# Patient Record
Sex: Female | Born: 1965 | Race: White | Hispanic: No | Marital: Single | Smoking: Current every day smoker
Health system: Southern US, Community
[De-identification: ages and names within clinical notes are randomized; demographics above are authoritative.]

## PROBLEM LIST (undated history)

## (undated) DIAGNOSIS — F1090 Alcohol use, unspecified, uncomplicated: Secondary | ICD-10-CM

## (undated) DIAGNOSIS — Z789 Other specified health status: Secondary | ICD-10-CM

## (undated) DIAGNOSIS — Z720 Tobacco use: Secondary | ICD-10-CM

## (undated) DIAGNOSIS — E119 Type 2 diabetes mellitus without complications: Secondary | ICD-10-CM

## (undated) DIAGNOSIS — Z7289 Other problems related to lifestyle: Secondary | ICD-10-CM

---

## 2008-07-04 ENCOUNTER — Emergency Department: Payer: Self-pay

## 2008-07-04 IMAGING — CR DG CHEST 2V
1 series · 2 of 2 positions shown · non-contrast
Comparison: none

REASON FOR EXAM: palpitations
COMMENTS:

PROCEDURE:     DXR - DXR CHEST PA (OR AP) AND LATERAL  - [DATE] [DATE]
RESULT:     The lung fields are clear. No pneumonia, pneumothorax or pleural
effusion is seen. Heart size is normal. The chest appears mildly
hyperexpanded. The mediastinal and osseous structures are normal in
appearance.

[Series 1: view not recorded · 0.17mm/px · 2 of 2 slices shown]
[im 1/2]
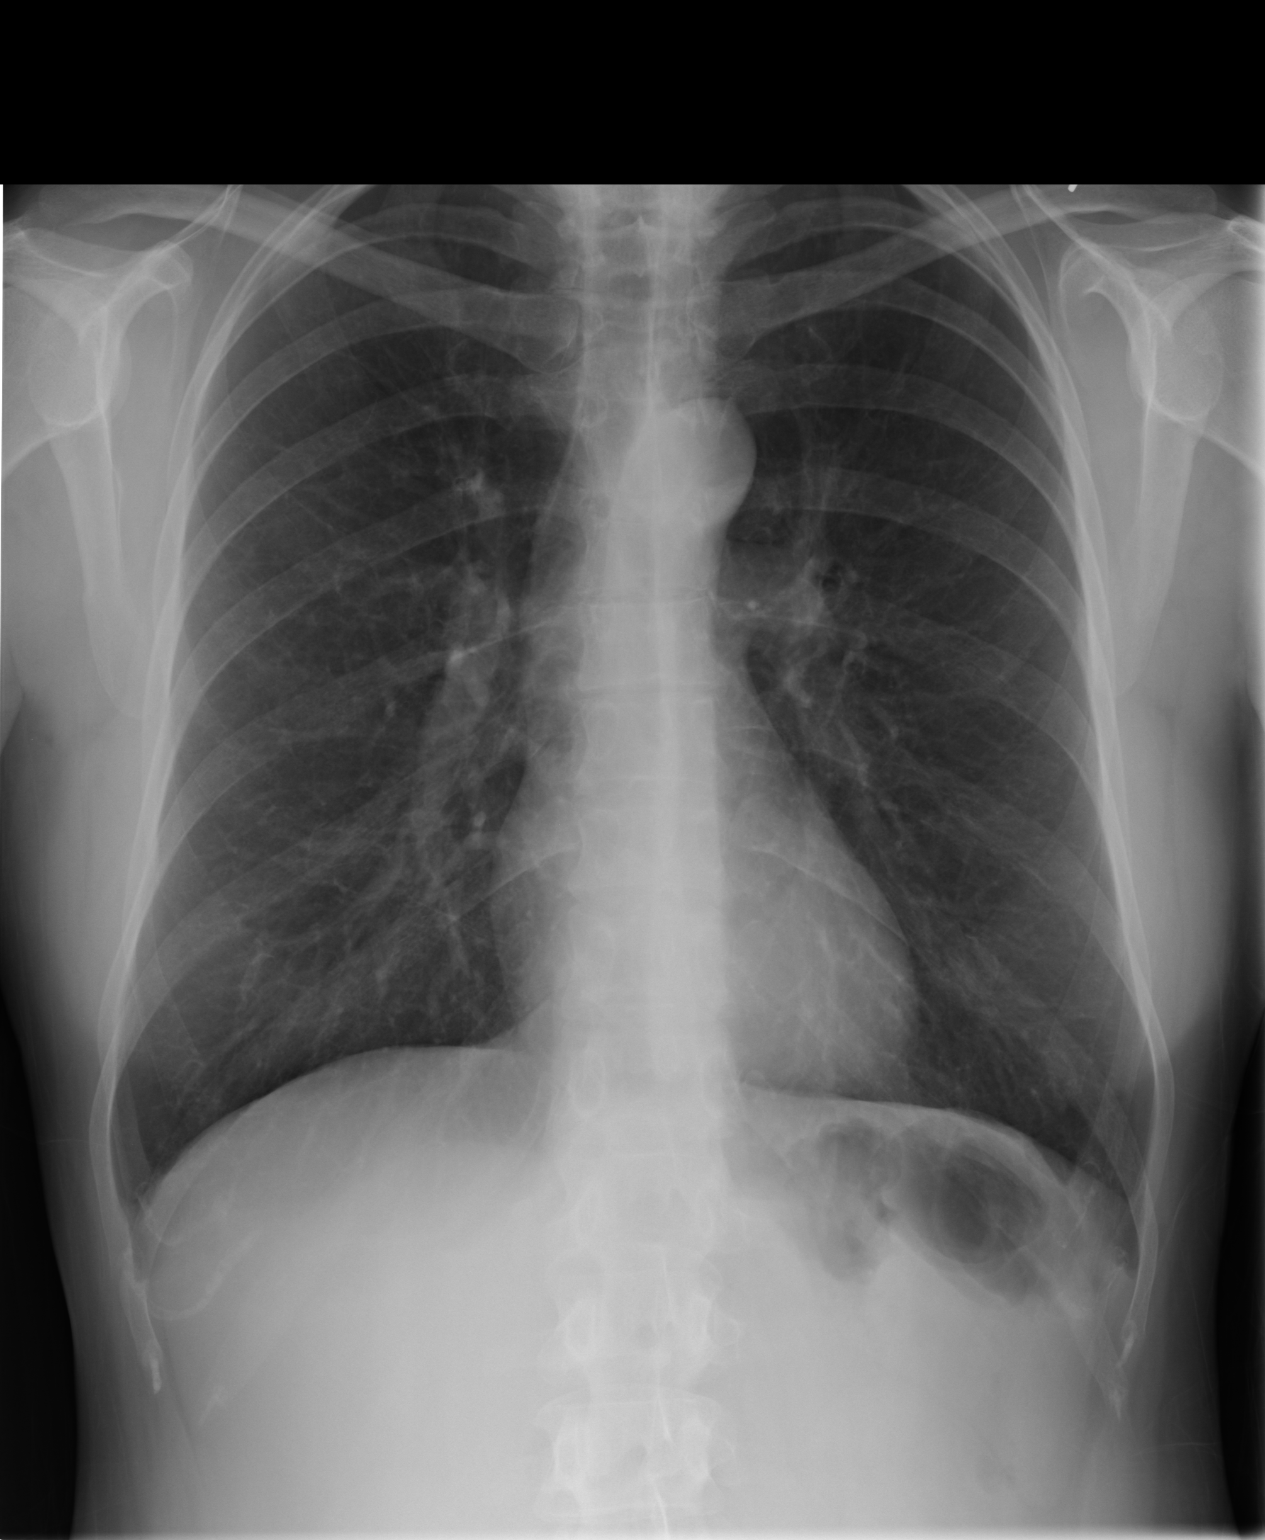
[im 2/2]
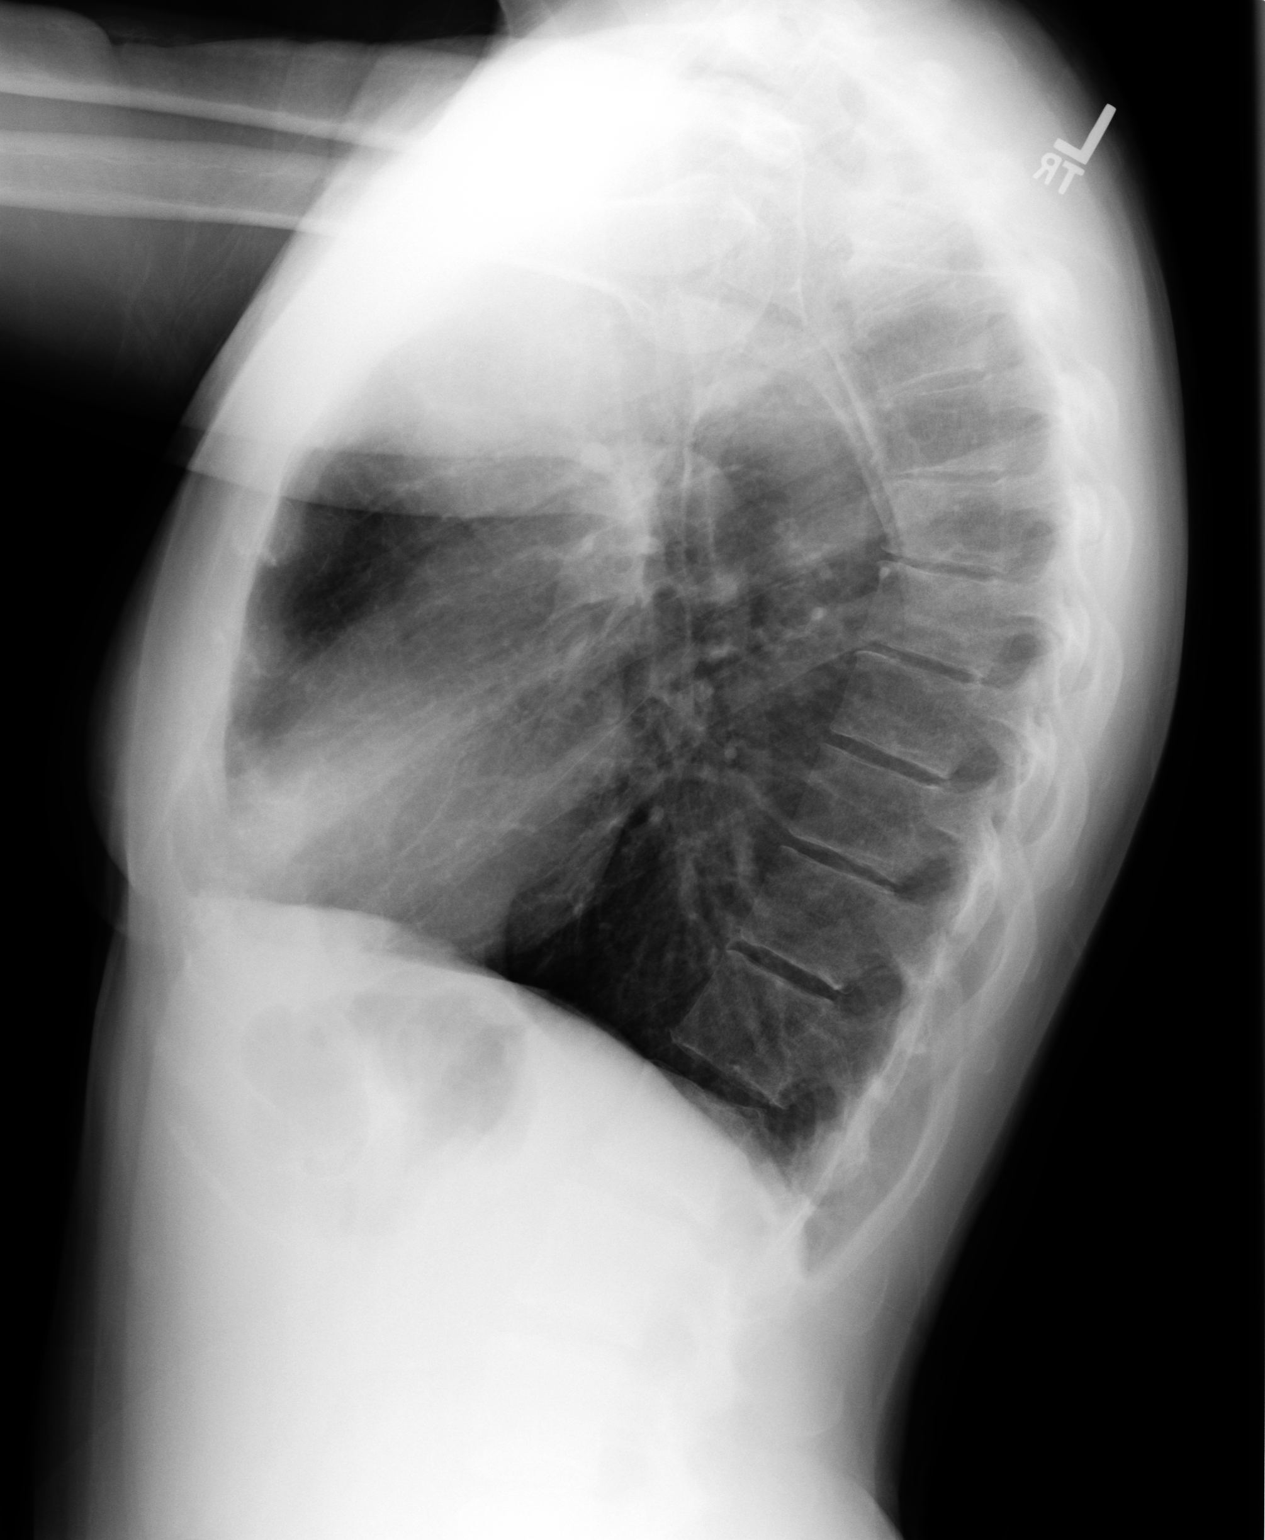

[2 of 2 positions shown; findings below may reference images not displayed]

IMPRESSION: 1. The lung fields are clear.
2. The chest appears mildly hyperexpanded.

## 2009-02-08 ENCOUNTER — Ambulatory Visit: Payer: Self-pay | Admitting: Family Medicine

## 2013-09-17 ENCOUNTER — Ambulatory Visit: Payer: Self-pay | Admitting: Nurse Practitioner

## 2013-09-17 IMAGING — US ABDOMEN ULTRASOUND LIMITED
1 series · 14 of 25 positions shown · non-contrast
Comparison: None.

CLINICAL DATA: Epigastric and right upper quadrant pain; history of
heavy alcohol use, elevated hepatic function studies

EXAM:
US ABDOMEN LIMITED - RIGHT UPPER QUADRANT

[Series 1: abdomen ultrasound limited · 0.21mm/px · 14 of 38 slices shown]
[im 1/38]
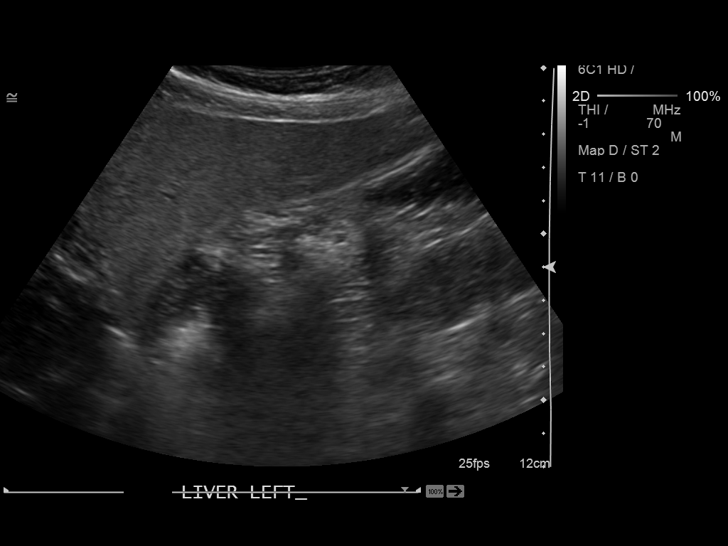
[im 4/38]
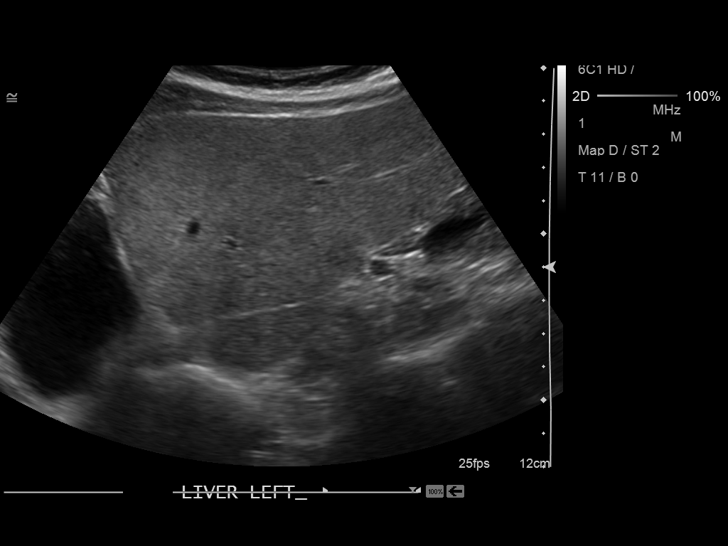
[im 7/38]
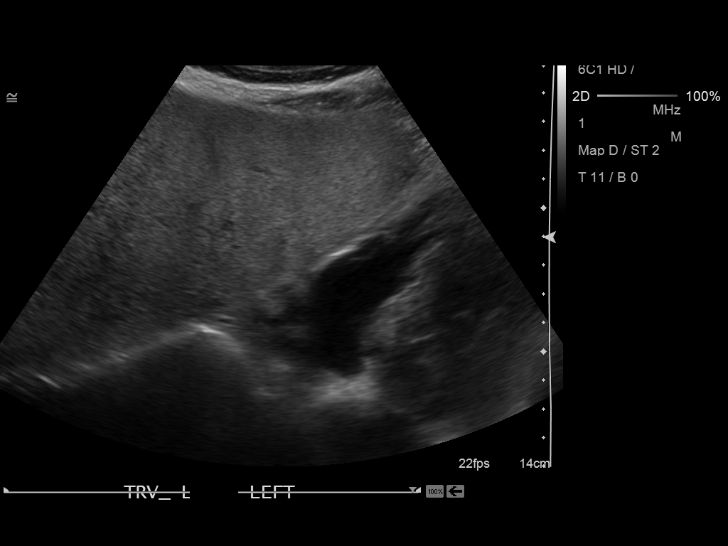
[im 10/38]
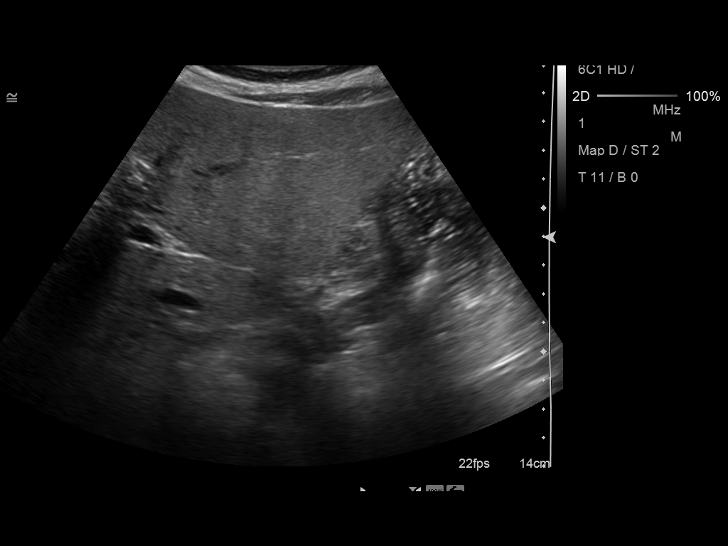
[im 13/38]
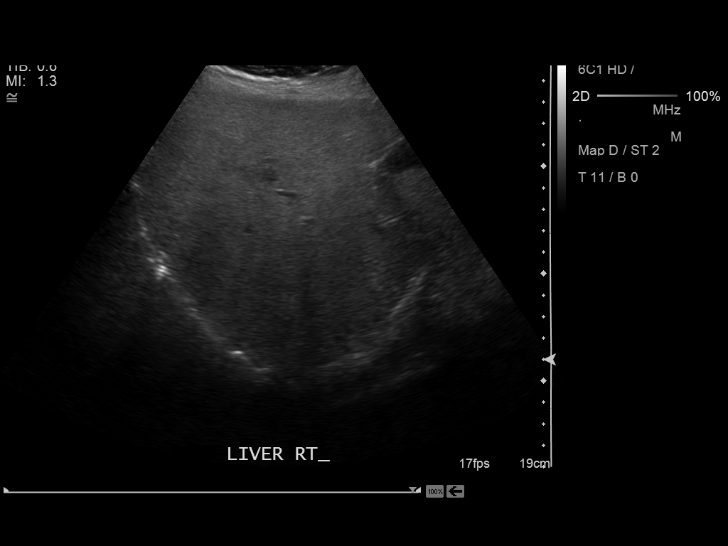
[im 14/38]
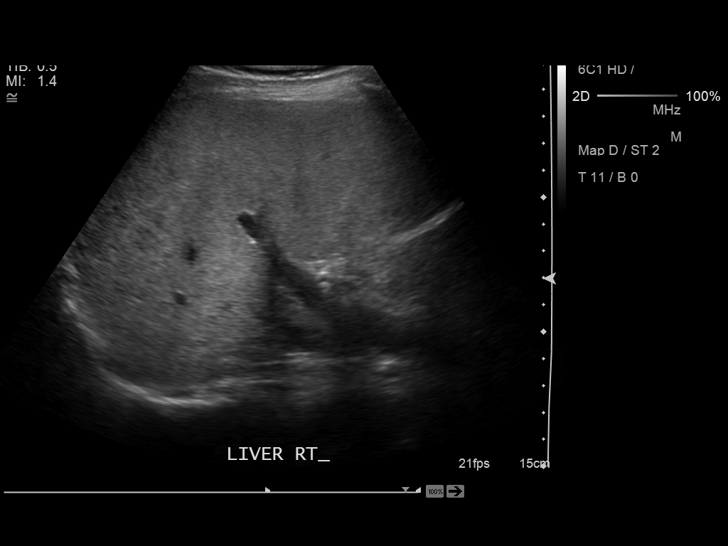
[im 17/38]
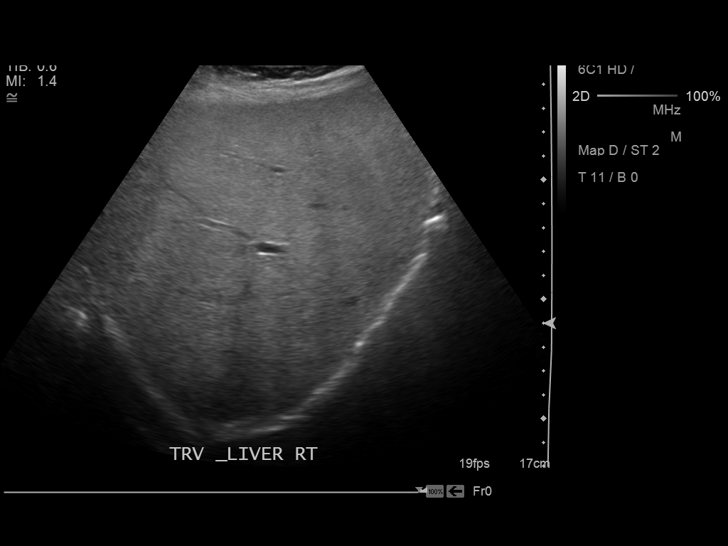
[im 21/38]
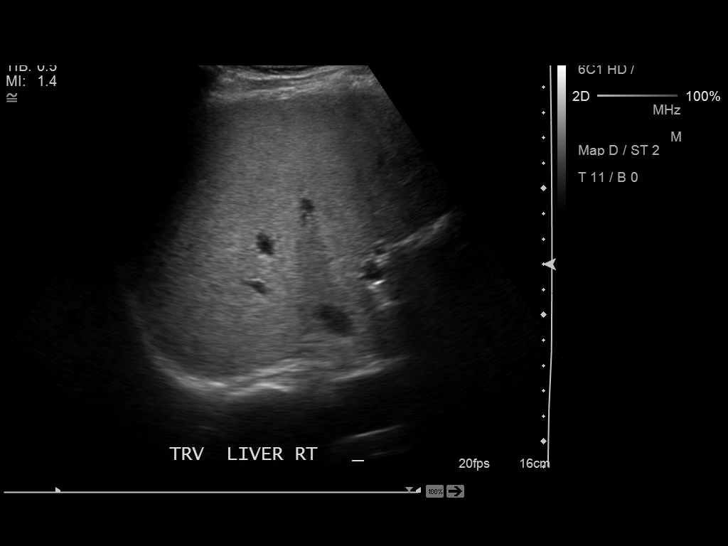
[im 24/38]
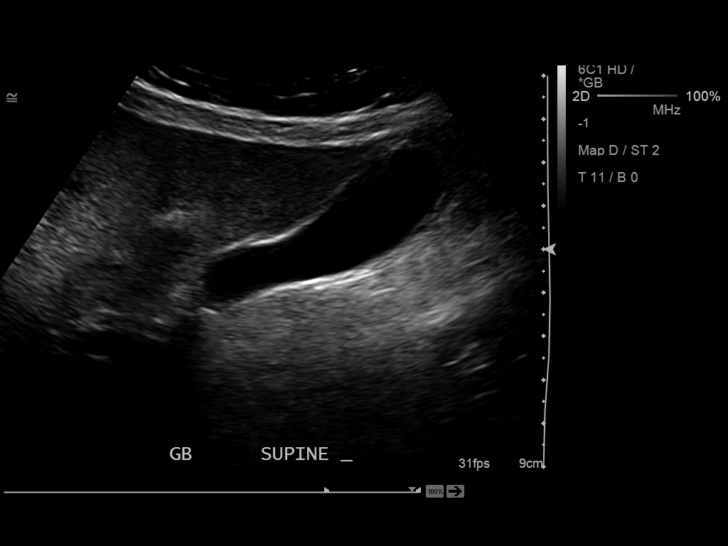
[im 25/38]
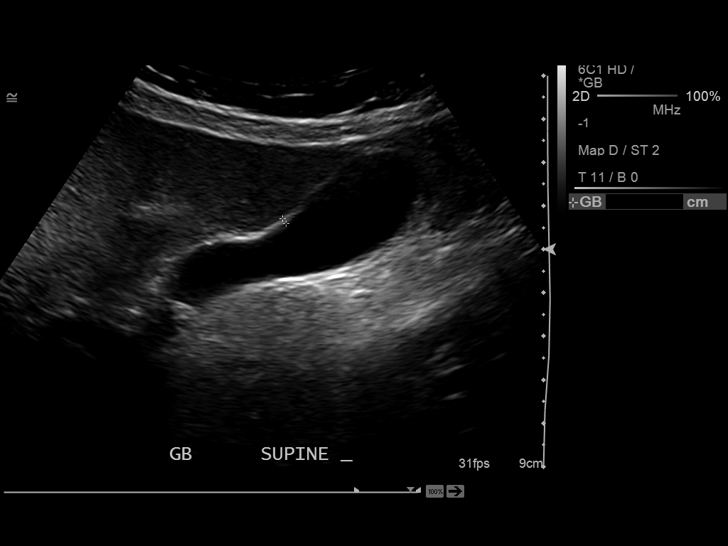
[im 28/38]
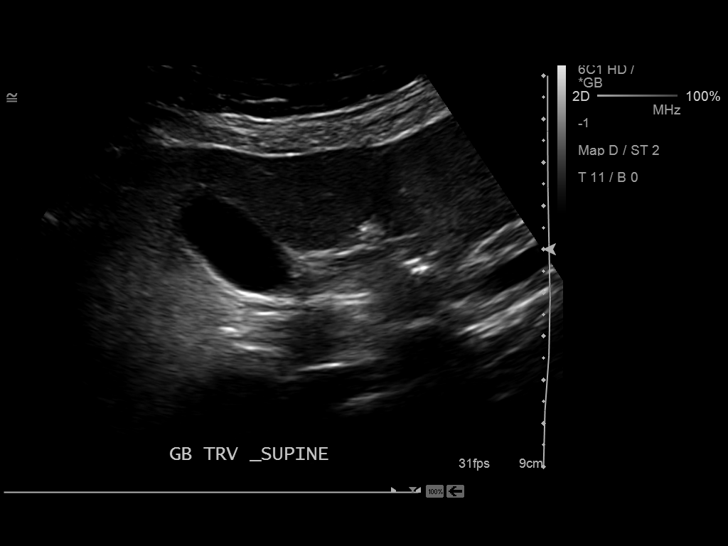
[im 31/38]
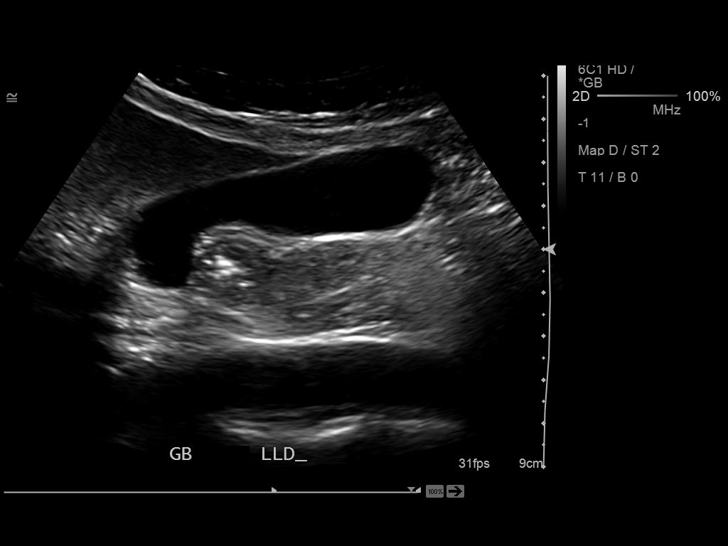
[im 34/38]
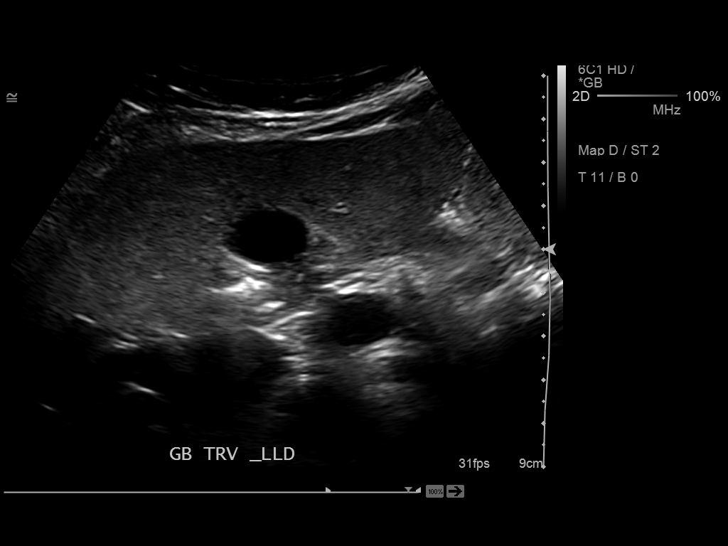
[im 38/38]
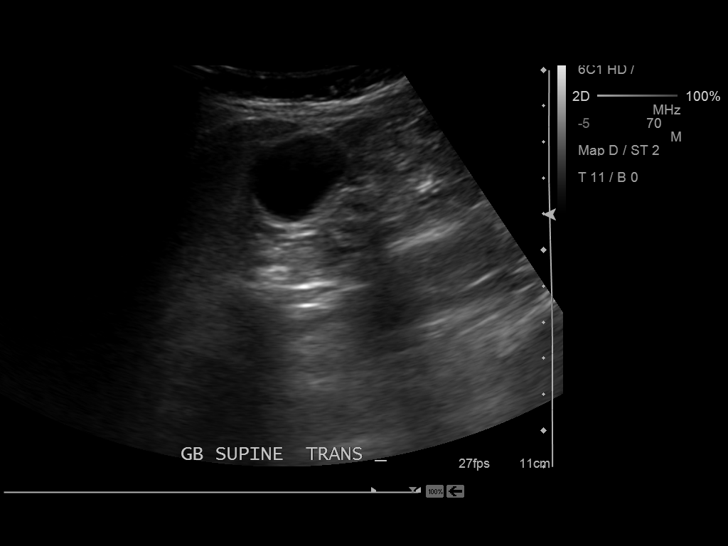

[14 of 25 positions shown; findings below may reference images not displayed]

FINDINGS: Gallbladder:

No gallstones or wall thickening visualized. No sonographic Murphy
sign noted.

Common bile duct:

Diameter: 3.4 mm

Liver:

No focal lesion identified. Within normal limits in parenchymal
echogenicity. No intrahepatic ductal dilation.
IMPRESSION: Normal limited right upper quadrant ultrasound.

## 2013-09-28 ENCOUNTER — Ambulatory Visit: Payer: Self-pay | Admitting: Internal Medicine

## 2013-09-28 LAB — URINALYSIS, COMPLETE
BACTERIA: NEGATIVE
BILIRUBIN, UR: NEGATIVE
BLOOD: NEGATIVE
Glucose,UR: NEGATIVE mg/dL (ref 0–75)
KETONE: NEGATIVE
LEUKOCYTE ESTERASE: NEGATIVE
NITRITE: NEGATIVE
PROTEIN: NEGATIVE
Ph: 6 (ref 4.5–8.0)
Specific Gravity: 1.005 (ref 1.003–1.030)
WBC UR: NONE SEEN /HPF (ref 0–5)

## 2013-09-28 LAB — PREGNANCY, URINE: PREGNANCY TEST, URINE: NEGATIVE m[IU]/mL

## 2013-09-28 IMAGING — CR DG ABDOMEN 2V
2 series · 2 of 2 positions shown · non-contrast
Comparison: None.

CLINICAL DATA: Left flank and abdominal pain.  Bloating.

EXAM:
ABDOMEN - 2 VIEW

[abdomen erect]
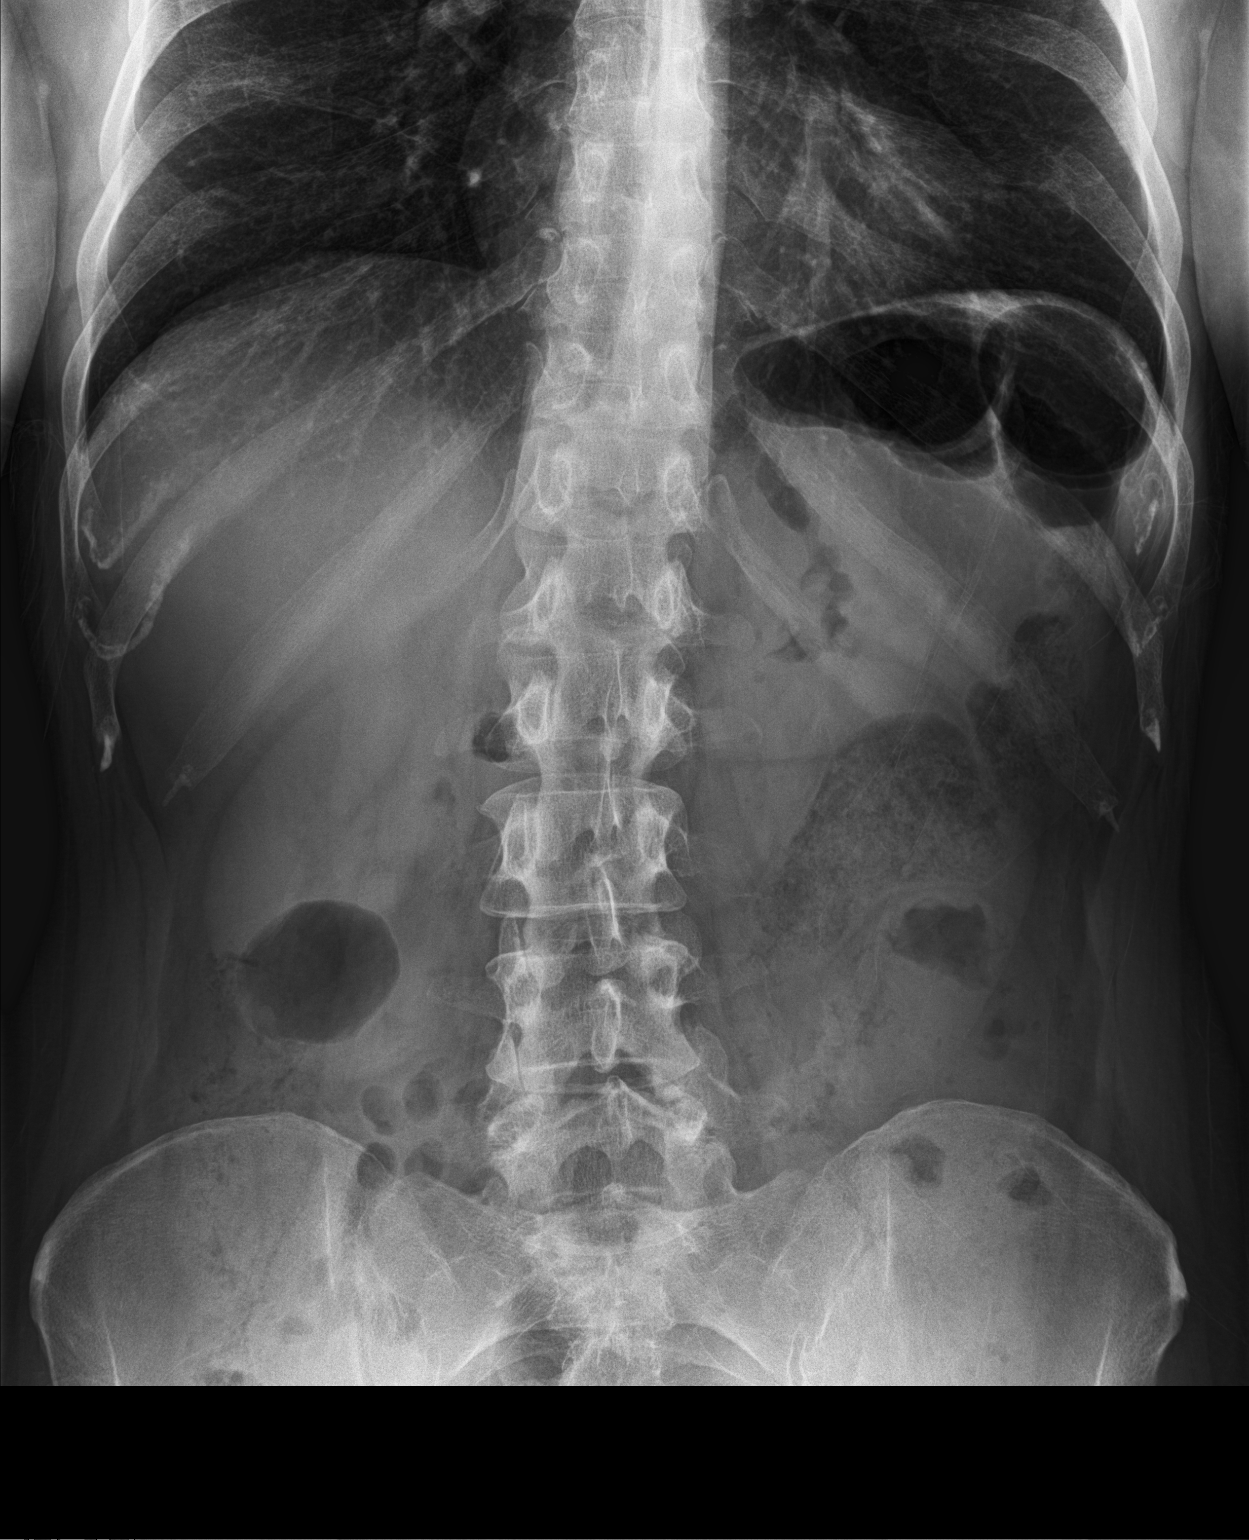

[abdomen supine]
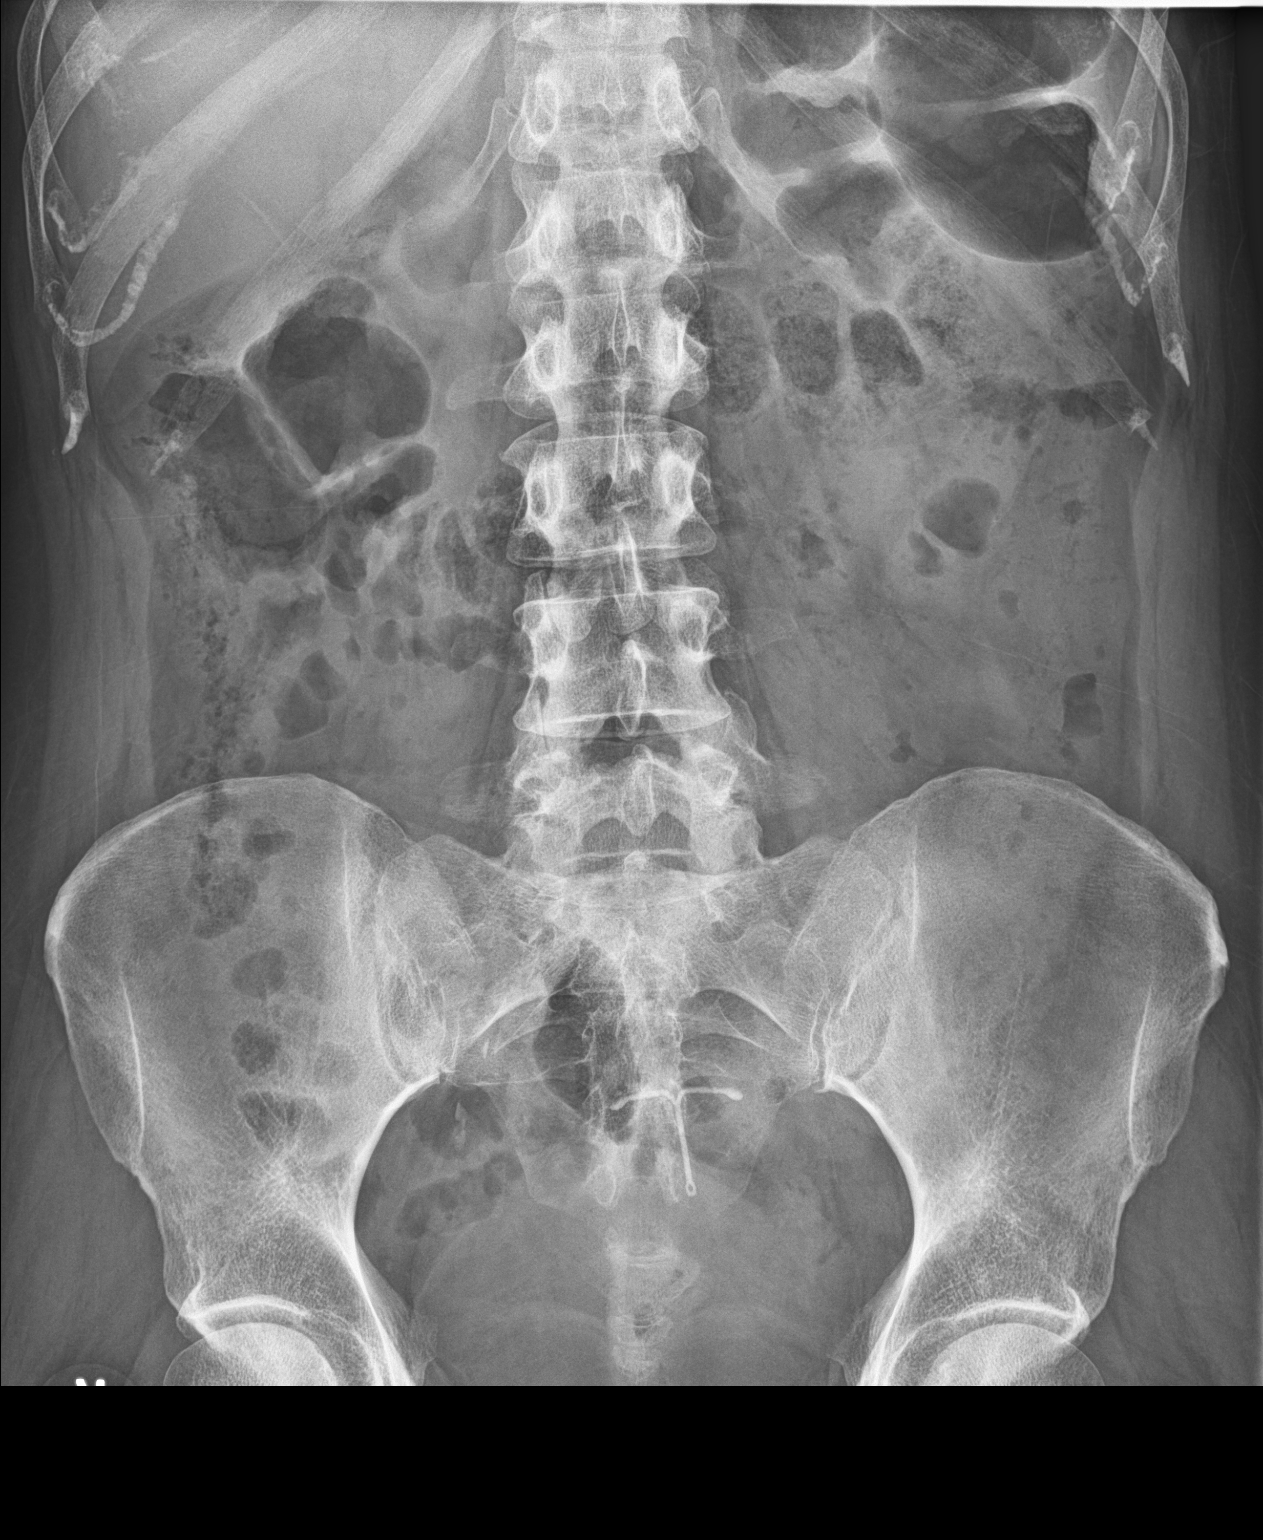

[2 of 2 positions shown; findings below may reference images not displayed]

FINDINGS: The bowel gas pattern is normal. There is no evidence of free air.
No radio-opaque calculi or other significant radiographic
abnormality is seen. IUD noted in pelvis.
IMPRESSION: Negative.

## 2013-09-28 IMAGING — CR DG ABDOMEN 1V
1 series · 1 of 1 positions shown · non-contrast
Comparison: None.

CLINICAL DATA: Abdominal pain

EXAM:
ABDOMEN - 1 VIEW

[abdomen kub]
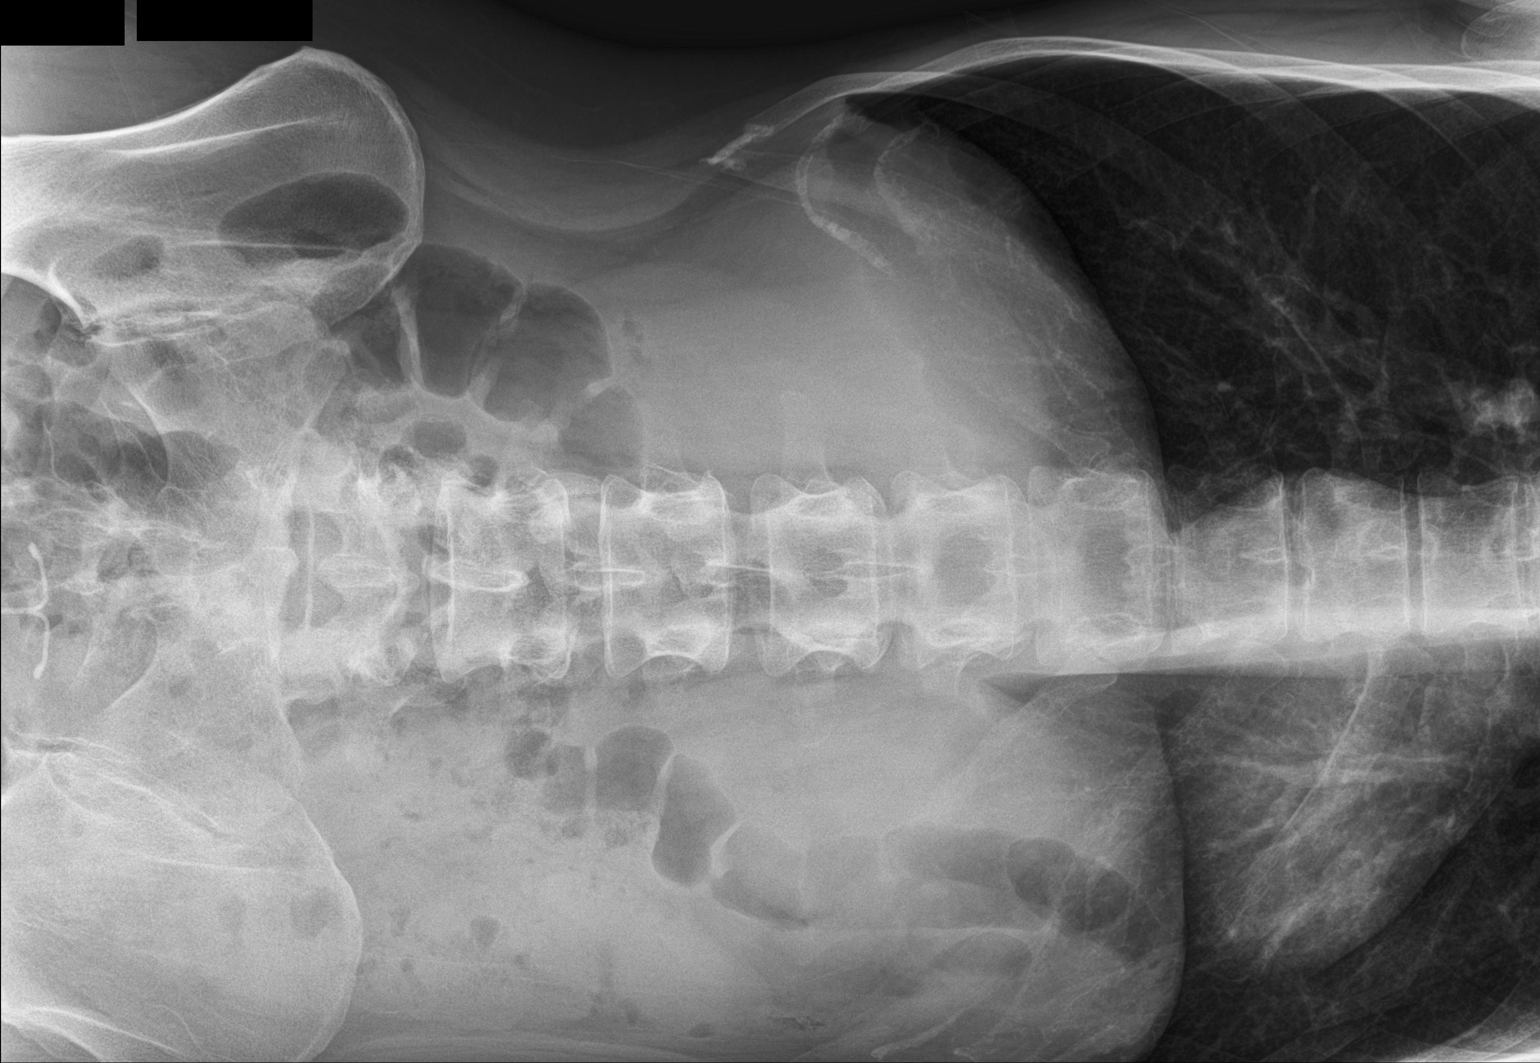

[1 of 1 positions shown; findings below may reference images not displayed]

FINDINGS: A left lateral decubitus film of the abdomen shows no evidence of
free air. An IUD is present in the mid pelvis.
IMPRESSION: No bowel obstruction.  No free air.

## 2015-06-01 ENCOUNTER — Other Ambulatory Visit: Payer: Self-pay | Admitting: Physician Assistant

## 2015-06-01 DIAGNOSIS — Z1231 Encounter for screening mammogram for malignant neoplasm of breast: Secondary | ICD-10-CM

## 2015-06-08 ENCOUNTER — Ambulatory Visit
Admission: RE | Admit: 2015-06-08 | Discharge: 2015-06-08 | Disposition: A | Discharge status: Home / Self Care | Payer: BLUE CROSS/BLUE SHIELD | Source: Ambulatory Visit | Attending: Physician Assistant | Admitting: Physician Assistant

## 2015-06-08 DIAGNOSIS — Z1231 Encounter for screening mammogram for malignant neoplasm of breast: Secondary | ICD-10-CM | POA: Insufficient documentation

## 2015-06-08 IMAGING — MG MM DIGITAL SCREENING BILATERAL
4 series · 4 of 4 positions shown · non-contrast
Comparison: Previous exam(s).

CLINICAL DATA: Screening.

EXAM:
DIGITAL SCREENING BILATERAL MAMMOGRAM WITH CAD

[L CC]
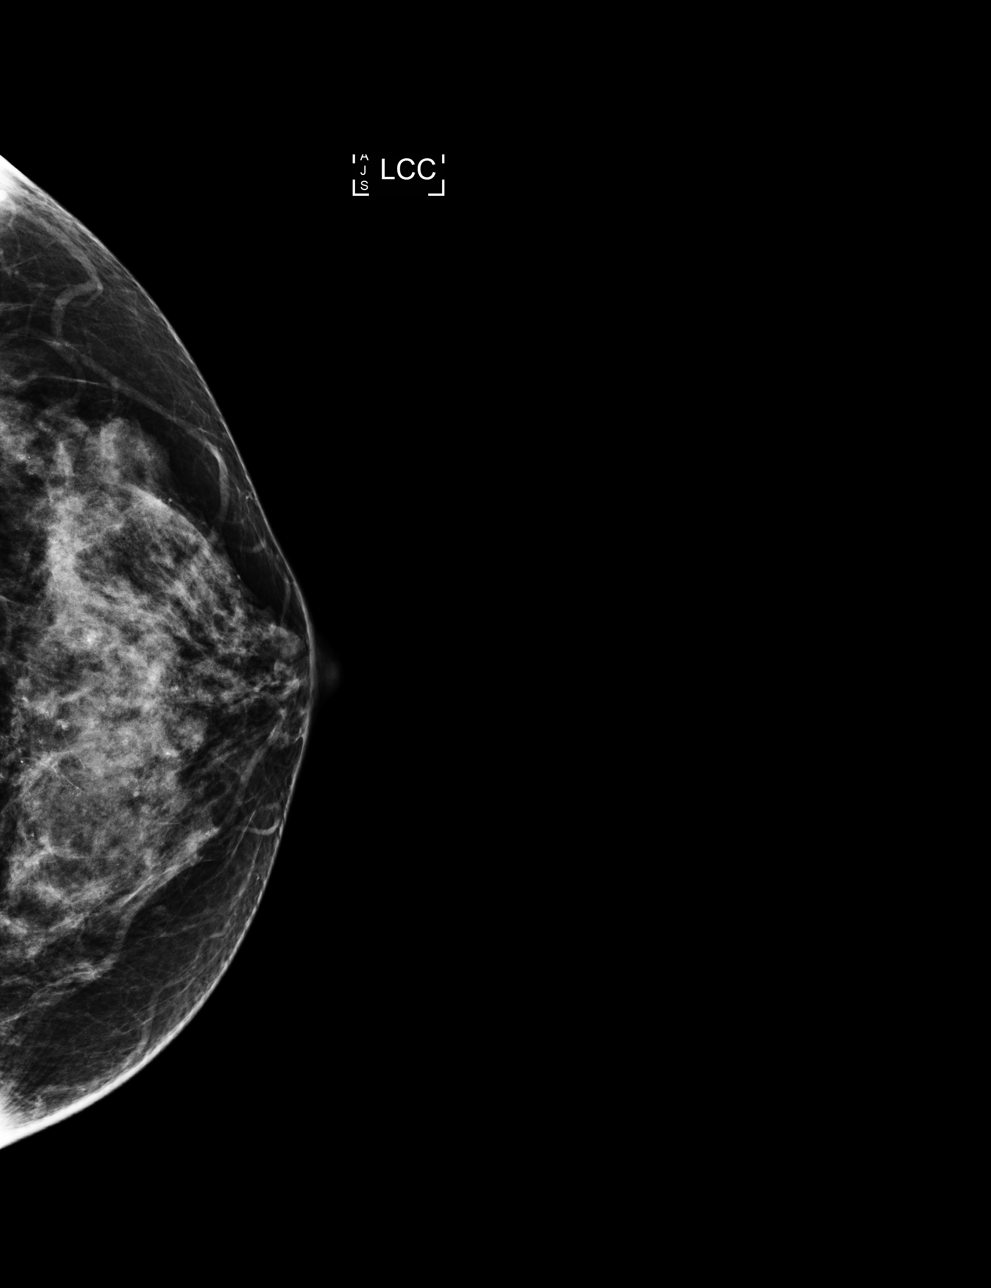

[R MLO]
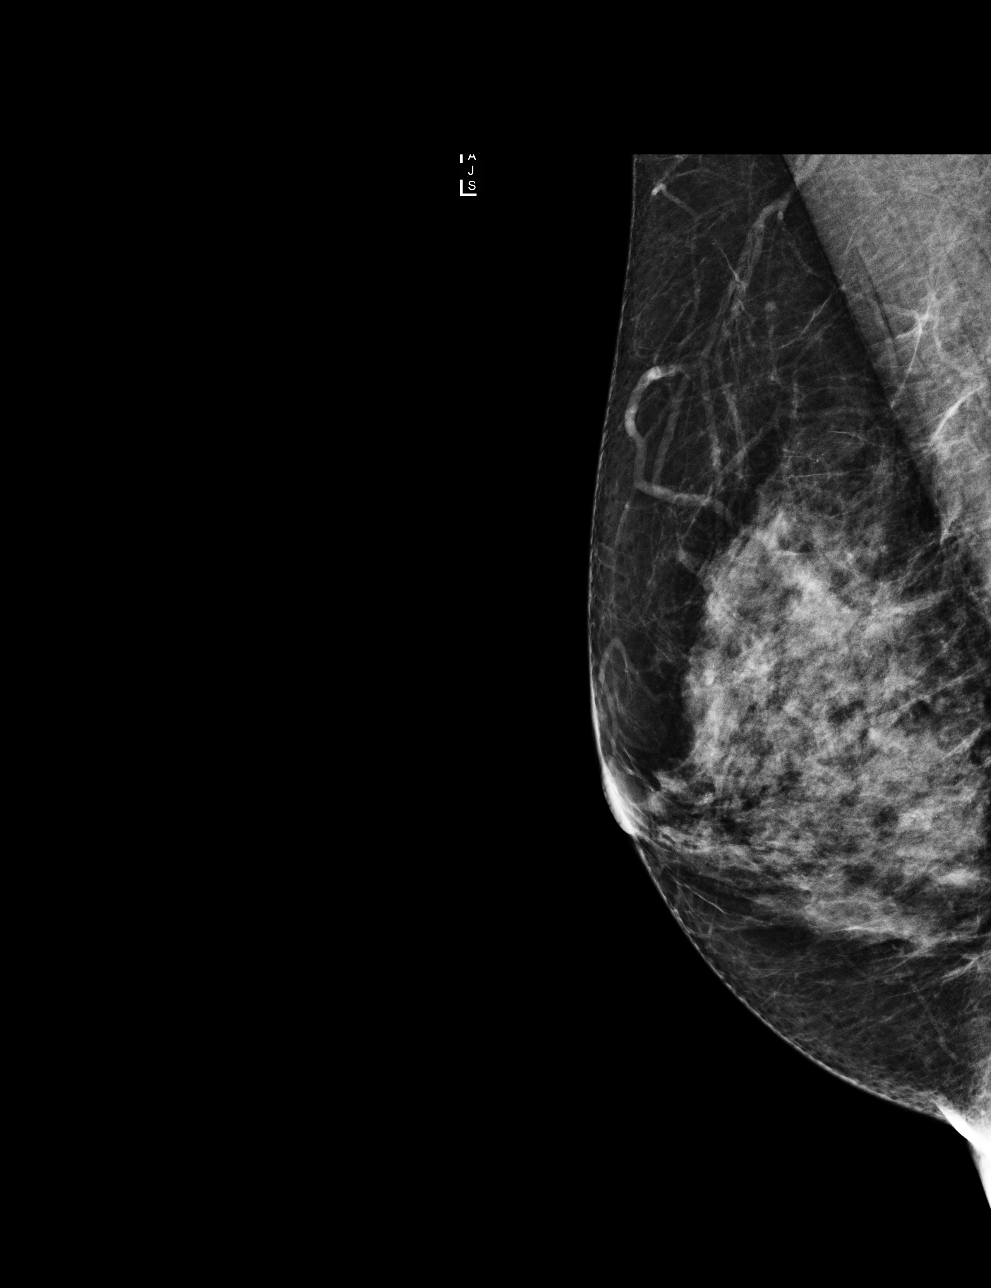

[L MLO]
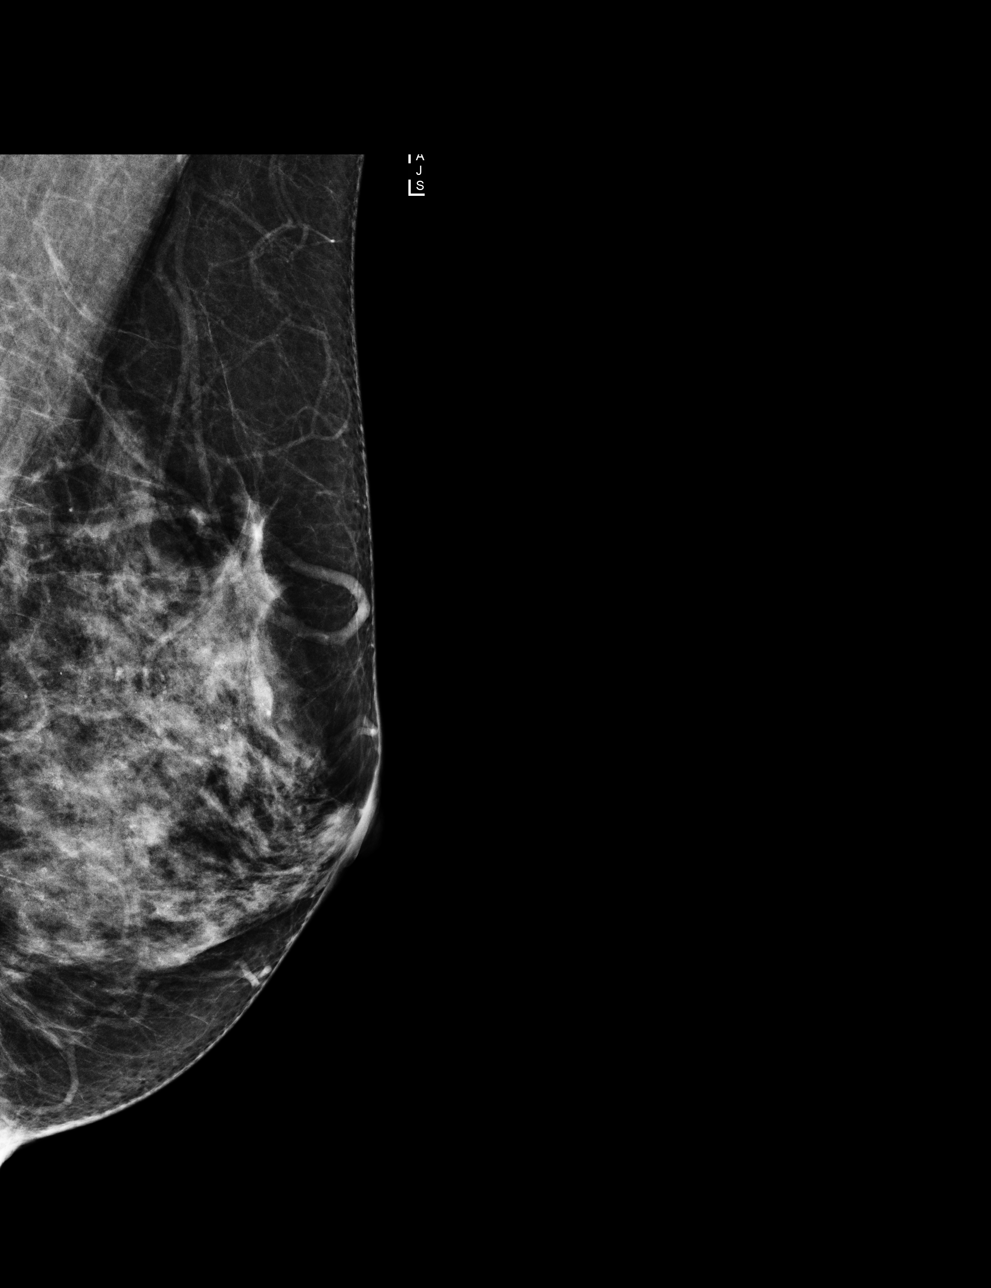

[R CC]
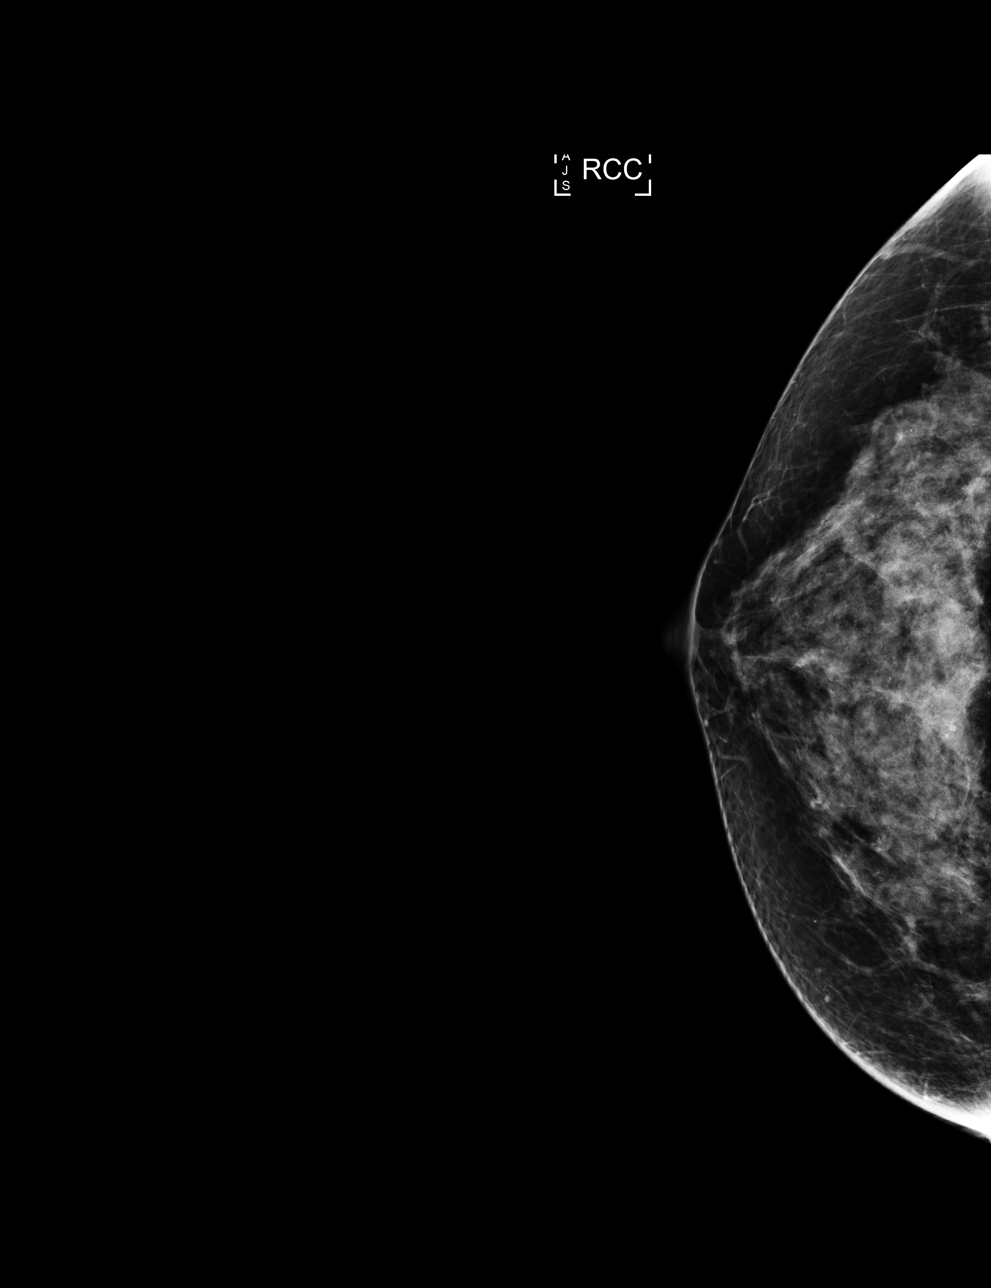

[4 of 4 positions shown; findings below may reference images not displayed]

ACR Breast Density Category d: The breast tissue is extremely dense,
which lowers the sensitivity of mammography.
FINDINGS: There are no findings suspicious for malignancy. Images were
processed with CAD.
IMPRESSION: No mammographic evidence of malignancy. A result letter of this
screening mammogram will be mailed directly to the patient.

RECOMMENDATION:
Screening mammogram in one year. (Code:[ZH])

BI-RADS CATEGORY  1: Negative.

## 2019-07-12 ENCOUNTER — Inpatient Hospital Stay: Payer: BLUE CROSS/BLUE SHIELD

## 2019-07-12 ENCOUNTER — Other Ambulatory Visit: Payer: Self-pay

## 2019-07-12 ENCOUNTER — Observation Stay
Admission: EM | Admit: 2019-07-12 | Discharge: 2019-07-13 | Disposition: A | Discharge status: Home / Self Care | Payer: BLUE CROSS/BLUE SHIELD | Attending: Internal Medicine | Admitting: Internal Medicine

## 2019-07-12 ENCOUNTER — Encounter: Payer: Self-pay | Admitting: Internal Medicine

## 2019-07-12 DIAGNOSIS — Z7289 Other problems related to lifestyle: Secondary | ICD-10-CM

## 2019-07-12 DIAGNOSIS — D62 Acute posthemorrhagic anemia: Secondary | ICD-10-CM | POA: Insufficient documentation

## 2019-07-12 DIAGNOSIS — K921 Melena: Secondary | ICD-10-CM | POA: Diagnosis not present

## 2019-07-12 DIAGNOSIS — Z20822 Contact with and (suspected) exposure to covid-19: Secondary | ICD-10-CM | POA: Insufficient documentation

## 2019-07-12 DIAGNOSIS — F1010 Alcohol abuse, uncomplicated: Secondary | ICD-10-CM | POA: Diagnosis not present

## 2019-07-12 DIAGNOSIS — K3189 Other diseases of stomach and duodenum: Secondary | ICD-10-CM | POA: Insufficient documentation

## 2019-07-12 DIAGNOSIS — Z789 Other specified health status: Secondary | ICD-10-CM

## 2019-07-12 DIAGNOSIS — K449 Diaphragmatic hernia without obstruction or gangrene: Secondary | ICD-10-CM | POA: Diagnosis not present

## 2019-07-12 DIAGNOSIS — K920 Hematemesis: Secondary | ICD-10-CM

## 2019-07-12 DIAGNOSIS — F1090 Alcohol use, unspecified, uncomplicated: Secondary | ICD-10-CM

## 2019-07-12 DIAGNOSIS — K922 Gastrointestinal hemorrhage, unspecified: Secondary | ICD-10-CM | POA: Diagnosis not present

## 2019-07-12 DIAGNOSIS — F17200 Nicotine dependence, unspecified, uncomplicated: Secondary | ICD-10-CM | POA: Diagnosis not present

## 2019-07-12 DIAGNOSIS — G8929 Other chronic pain: Secondary | ICD-10-CM | POA: Diagnosis not present

## 2019-07-12 DIAGNOSIS — K2950 Unspecified chronic gastritis without bleeding: Secondary | ICD-10-CM | POA: Insufficient documentation

## 2019-07-12 DIAGNOSIS — Z720 Tobacco use: Secondary | ICD-10-CM | POA: Diagnosis not present

## 2019-07-12 DIAGNOSIS — K2980 Duodenitis without bleeding: Secondary | ICD-10-CM | POA: Diagnosis not present

## 2019-07-12 DIAGNOSIS — Z885 Allergy status to narcotic agent status: Secondary | ICD-10-CM | POA: Diagnosis not present

## 2019-07-12 DIAGNOSIS — K254 Chronic or unspecified gastric ulcer with hemorrhage: Principal | ICD-10-CM | POA: Insufficient documentation

## 2019-07-12 DIAGNOSIS — R1084 Generalized abdominal pain: Secondary | ICD-10-CM

## 2019-07-12 HISTORY — DX: Alcohol use, unspecified, uncomplicated: F10.90

## 2019-07-12 HISTORY — DX: Tobacco use: Z72.0

## 2019-07-12 HISTORY — DX: Other specified health status: Z78.9

## 2019-07-12 HISTORY — DX: Other problems related to lifestyle: Z72.89

## 2019-07-12 HISTORY — DX: Type 2 diabetes mellitus without complications: E11.9

## 2019-07-12 LAB — CBC
HCT: 38.1 % (ref 36.0–46.0)
HCT: 34.3 % — ABNORMAL LOW (ref 36.0–46.0)
HCT: 30.1 % — ABNORMAL LOW (ref 36.0–46.0)
HCT: 31.4 % — ABNORMAL LOW (ref 36.0–46.0)
Hemoglobin: 13.4 g/dL (ref 12.0–15.0)
Hemoglobin: 12 g/dL (ref 12.0–15.0)
Hemoglobin: 10.8 g/dL — ABNORMAL LOW (ref 12.0–15.0)
Hemoglobin: 11 g/dL — ABNORMAL LOW (ref 12.0–15.0)
MCH: 34.9 pg — ABNORMAL HIGH (ref 26.0–34.0)
MCH: 35.3 pg — ABNORMAL HIGH (ref 26.0–34.0)
MCH: 35 pg — ABNORMAL HIGH (ref 26.0–34.0)
MCH: 35.3 pg — ABNORMAL HIGH (ref 26.0–34.0)
MCHC: 35.2 g/dL (ref 30.0–36.0)
MCHC: 35 g/dL (ref 30.0–36.0)
MCHC: 35.9 g/dL (ref 30.0–36.0)
MCHC: 35 g/dL (ref 30.0–36.0)
MCV: 99.2 fL (ref 80.0–100.0)
MCV: 100.9 fL — ABNORMAL HIGH (ref 80.0–100.0)
MCV: 97.4 fL (ref 80.0–100.0)
MCV: 100.6 fL — ABNORMAL HIGH (ref 80.0–100.0)
Platelets: 262 10*3/uL (ref 150–400)
Platelets: 230 10*3/uL (ref 150–400)
Platelets: 224 10*3/uL (ref 150–400)
Platelets: 221 10*3/uL (ref 150–400)
RBC: 3.84 MIL/uL — ABNORMAL LOW (ref 3.87–5.11)
RBC: 3.4 MIL/uL — ABNORMAL LOW (ref 3.87–5.11)
RBC: 3.09 MIL/uL — ABNORMAL LOW (ref 3.87–5.11)
RBC: 3.12 MIL/uL — ABNORMAL LOW (ref 3.87–5.11)
RDW: 12.7 % (ref 11.5–15.5)
RDW: 12.9 % (ref 11.5–15.5)
RDW: 13 % (ref 11.5–15.5)
RDW: 13.1 % (ref 11.5–15.5)
WBC: 9.9 10*3/uL (ref 4.0–10.5)
WBC: 8.9 10*3/uL (ref 4.0–10.5)
WBC: 6.4 10*3/uL (ref 4.0–10.5)
WBC: 7.4 10*3/uL (ref 4.0–10.5)
nRBC: 0 % (ref 0.0–0.2)
nRBC: 0 % (ref 0.0–0.2)
nRBC: 0 % (ref 0.0–0.2)
nRBC: 0 % (ref 0.0–0.2)

## 2019-07-12 LAB — IRON AND TIBC
Iron: 212 ug/dL — ABNORMAL HIGH (ref 28–170)
Saturation Ratios: 81 % — ABNORMAL HIGH (ref 10.4–31.8)
TIBC: 260 ug/dL (ref 250–450)
UIBC: 48 ug/dL

## 2019-07-12 LAB — COMPREHENSIVE METABOLIC PANEL
ALT: 29 U/L (ref 0–44)
AST: 43 U/L — ABNORMAL HIGH (ref 15–41)
Albumin: 4.3 g/dL (ref 3.5–5.0)
Alkaline Phosphatase: 94 U/L (ref 38–126)
Anion gap: 10 (ref 5–15)
BUN: 24 mg/dL — ABNORMAL HIGH (ref 6–20)
CO2: 26 mmol/L (ref 22–32)
Calcium: 9.8 mg/dL (ref 8.9–10.3)
Chloride: 96 mmol/L — ABNORMAL LOW (ref 98–111)
Creatinine, Ser: 0.64 mg/dL (ref 0.44–1.00)
GFR calc Af Amer: >60 mL/min (ref >60)
GFR calc non Af Amer: >60 mL/min (ref >60)
Glucose, Bld: 104 mg/dL — ABNORMAL HIGH (ref 70–99)
Potassium: 4 mmol/L (ref 3.5–5.1)
Sodium: 132 mmol/L — ABNORMAL LOW (ref 135–145)
Total Bilirubin: 0.8 mg/dL (ref 0.3–1.2)
Total Protein: 8.1 g/dL (ref 6.5–8.1)

## 2019-07-12 LAB — TYPE AND SCREEN
ABO/RH(D): A POS
Antibody Screen: NEGATIVE

## 2019-07-12 LAB — VITAMIN B12: Vitamin B-12: 136 pg/mL — ABNORMAL LOW (ref 180–914)

## 2019-07-12 LAB — ETHANOL: Alcohol, Ethyl (B): <10 mg/dL (ref <10)

## 2019-07-12 LAB — FOLATE: Folate: 7.3 ng/mL (ref >5.9)

## 2019-07-12 LAB — PREGNANCY, URINE: Preg Test, Ur: NEGATIVE

## 2019-07-12 LAB — RESPIRATORY PANEL BY RT PCR (FLU A&B, COVID)
Influenza A by PCR: NEGATIVE
Influenza B by PCR: NEGATIVE
SARS Coronavirus 2 by RT PCR: NEGATIVE

## 2019-07-12 LAB — APTT: aPTT: 29 seconds (ref 24–36)

## 2019-07-12 LAB — PROTIME-INR
INR: 1 (ref 0.8–1.2)
Prothrombin Time: 12.7 seconds (ref 11.4–15.2)

## 2019-07-12 LAB — LIPASE, BLOOD: Lipase: 28 U/L (ref 11–51)

## 2019-07-12 LAB — FERRITIN: Ferritin: 116 ng/mL (ref 11–307)

## 2019-07-12 IMAGING — DX DG CHEST 1V PORT
1 series · 1 of 1 positions shown · non-contrast
Comparison: [DATE]

CLINICAL DATA: Vomiting.  Blood in stool.

EXAM:
PORTABLE CHEST 1 VIEW

[chest ap]
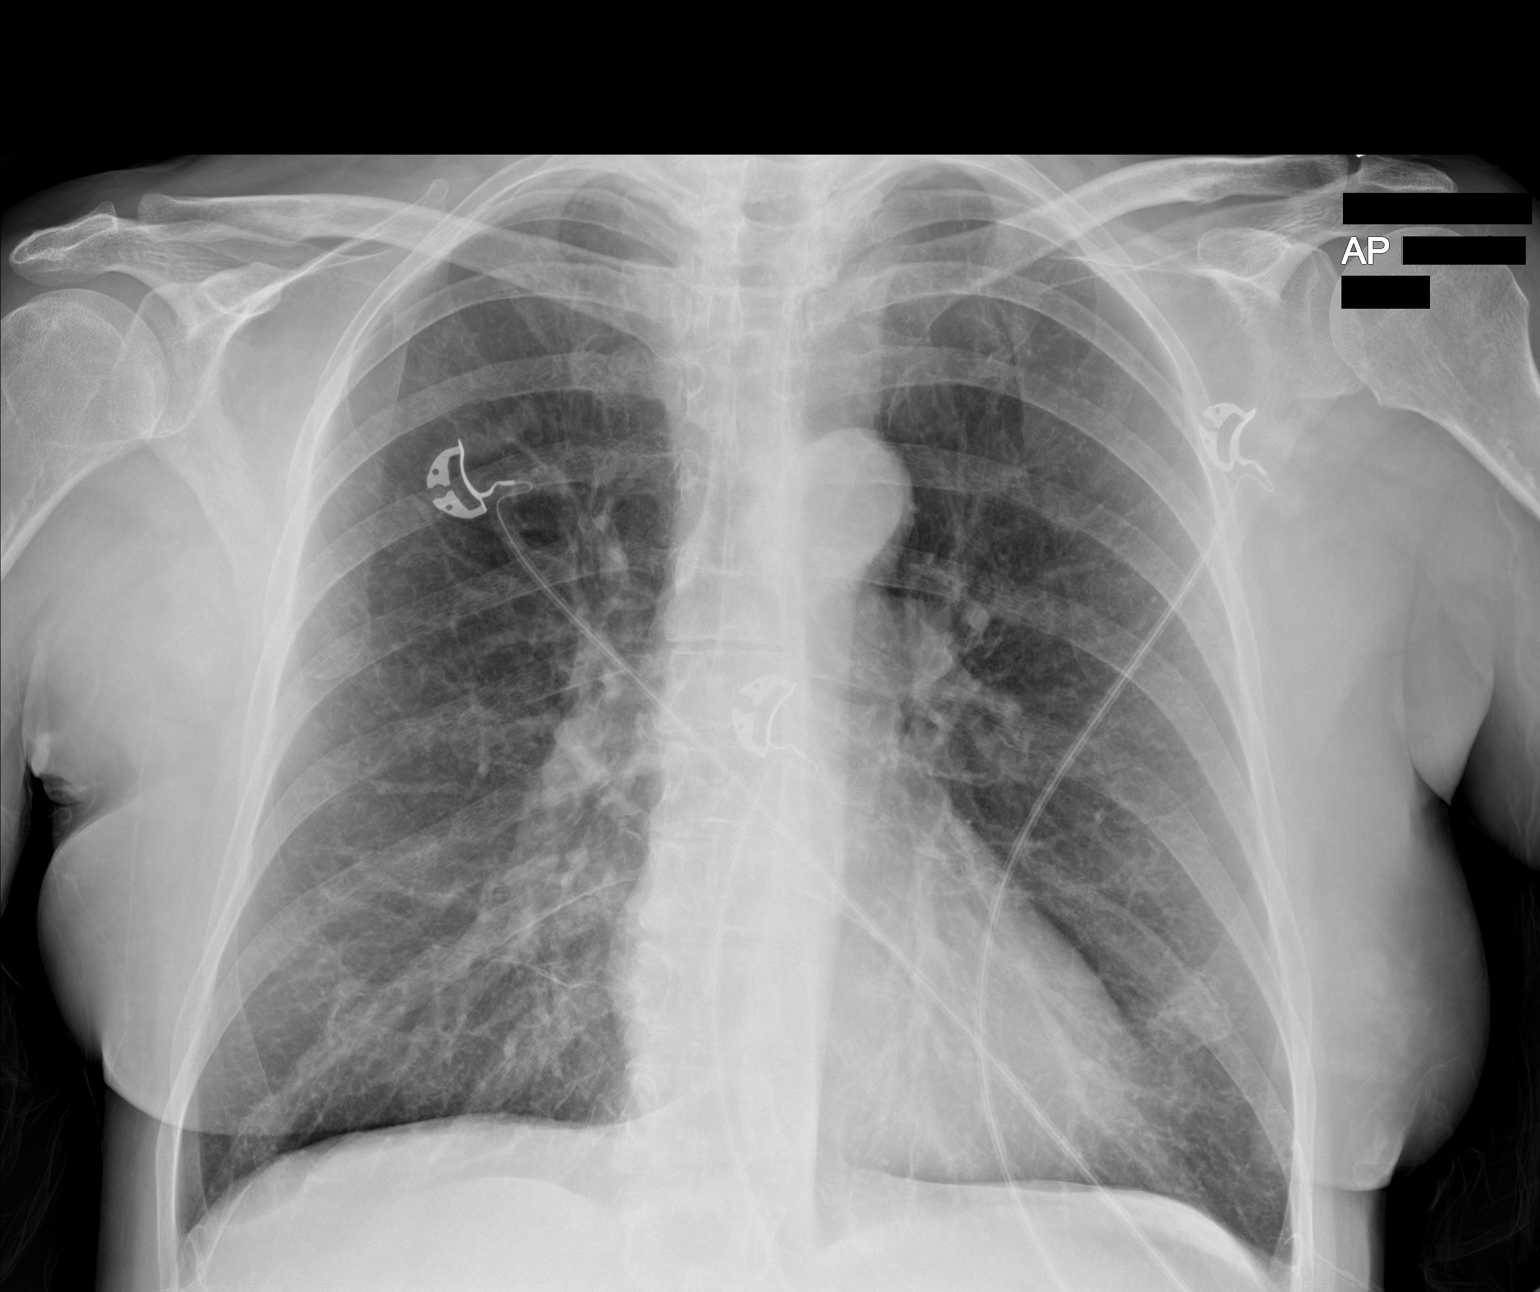

[1 of 1 positions shown; findings below may reference images not displayed]

FINDINGS: Generous lung volumes diaphragm flattening. There is no edema,
consolidation, effusion, or pneumothorax. Minimal scarring or
atelectasis over the right infrahilar lung. Normal heart size and
mediastinal contours. No acute osseous finding.
IMPRESSION: 1. No acute finding.
2. Large lung volumes, question risk factors for COPD.

## 2019-07-12 MED ORDER — SODIUM CHLORIDE 0.9 % IV SOLN
8.0000 mg/h | INTRAVENOUS | Status: DC
  Administered 2019-07-12 – 2019-07-13 (×3): 8 mg/h via INTRAVENOUS
  Filled 2019-07-12 (×4): qty 80

## 2019-07-12 MED ORDER — ACETAMINOPHEN 325 MG PO TABS
650.0000 mg | ORAL_TABLET | Freq: Four times a day (QID) | ORAL | Status: DC | PRN
  Administered 2019-07-12: 650 mg via ORAL
  Filled 2019-07-12: qty 2

## 2019-07-12 MED ORDER — SODIUM CHLORIDE 0.9 % IV SOLN
80.0000 mg | Freq: Once | INTRAVENOUS | Status: AC
  Administered 2019-07-12: 80 mg via INTRAVENOUS
  Filled 2019-07-12: qty 80

## 2019-07-12 MED ORDER — SODIUM CHLORIDE 0.9 % IV BOLUS
1000.0000 mL | Freq: Once | INTRAVENOUS | Status: AC
  Administered 2019-07-12: 1000 mL via INTRAVENOUS

## 2019-07-12 MED ORDER — ONDANSETRON HCL 4 MG/2ML IJ SOLN: 4.0000 mg | Freq: Three times a day (TID) | INTRAMUSCULAR | Status: DC | PRN

## 2019-07-12 MED ORDER — LORAZEPAM 2 MG/ML IJ SOLN
0.0000 mg | Freq: Four times a day (QID) | INTRAMUSCULAR | Status: DC
  Administered 2019-07-13: 2 mg via INTRAVENOUS
  Filled 2019-07-12: qty 1

## 2019-07-12 MED ORDER — ONDANSETRON HCL 4 MG/2ML IJ SOLN
4.0000 mg | Freq: Once | INTRAMUSCULAR | Status: AC
  Administered 2019-07-12: 4 mg via INTRAVENOUS
  Filled 2019-07-12: qty 2

## 2019-07-12 MED ORDER — NICOTINE 21 MG/24HR TD PT24
21.0000 mg | MEDICATED_PATCH | Freq: Every day | TRANSDERMAL | Status: DC
  Administered 2019-07-12: 21 mg via TRANSDERMAL
  Filled 2019-07-12: qty 1

## 2019-07-12 MED ORDER — SODIUM CHLORIDE 0.9 % IV SOLN: INTRAVENOUS | Status: DC

## 2019-07-12 MED ORDER — THIAMINE HCL 100 MG/ML IJ SOLN
100.0000 mg | Freq: Every day | INTRAMUSCULAR | Status: DC
  Administered 2019-07-12: 100 mg via INTRAVENOUS
  Filled 2019-07-12: qty 2

## 2019-07-12 NOTE — ED Triage Notes (Signed)
Patient reports vomiting and ? Blood in stool.  Reports began yesterday.

## 2019-07-12 NOTE — ED Provider Notes (Signed)
[facility] Emergency Department Provider Note   ____________________________________________   First MD Initiated Contact with Patient 07/12/19 [phone]     (approximate)  I have reviewed the triage vital signs and the nursing notes.   HISTORY  Chief Complaint Emesis    HPI Brandy Garner is a 54 y.o. female who presents to the ED from home with a chief complaint of vomiting blood and black stools.  Patient reports epigastric discomfort starting yesterday evening.  Overnight she began to vomit coffee-ground emesis.  Had a bowel movement and noted it looked black.  Drinks 4-6 beers daily.  No history of DTs.  States she has been taking ibuprofen's recently for back pain.  No prior history of GI bleed.  Denies fever, cough, chest pain, shortness of breath, dysuria, diarrhea.       Past medical history Anxiety Alcohol abuse Tobacco abuse Chronic back pain Herpes Chlamydia Elevated liver enzymes Vitamin D deficiency  Patient Active Problem List   Diagnosis Date Noted  . GIB (gastrointestinal bleeding) 07/12/2019  . Alcohol use 07/12/2019  . Tobacco abuse     Past surgical history C-section Wisdom teeth extraction  Prior to Admission medications   Medication Sig Start Date End Date Taking? Authorizing Provider  ibuprofen (ADVIL) 200 MG tablet Take 200 mg by mouth every 6 (six) hours as needed.   Yes [provider]    Allergies Fentanyl  Family History  Problem Relation Age of Onset  . Diabetes Mellitus II Father   . Heart disease Father     Social History Social History   Tobacco Use  . Smoking status: Current Every Day Smoker  . Smokeless tobacco: Never Used  Substance Use Topics  . Alcohol use: Yes  . Drug use: Never    Review of Systems  Constitutional: No fever/chills Eyes: No visual changes. ENT: No sore throat. Cardiovascular: Denies chest pain. Respiratory: Denies shortness of breath. Gastrointestinal:  Positive for epigastric discomfort, hematemesis and black stools.  No diarrhea.  No constipation. Genitourinary: Negative for dysuria. Musculoskeletal: Negative for back pain. Skin: Negative for rash. Neurological: Negative for headaches, focal weakness or numbness.   ____________________________________________   PHYSICAL EXAM:  VITAL SIGNS: ED Triage Vitals  Enc Vitals Group     BP 07/12/19 0538 (!) 171/116     Pulse Rate 07/12/19 0538 (!) 130     Resp 07/12/19 0538 18     Temp --      Temp src --      SpO2 07/12/19 0538 100 %     Weight 07/12/19 0540 156 lb (70.8 kg)     Height 07/12/19 0540 5\' 6"  (1.676 m)     Head Circumference --      Peak Flow --      Pain Score 07/12/19 0539 8     Pain Loc --      Pain Edu? --      Excl. in GC? --     Constitutional: Alert and oriented.  Uncomfortable appearing and in mild acute distress. Eyes: Conjunctivae are normal. PERRL. EOMI. Head: Atraumatic. Nose: No congestion/rhinnorhea. Mouth/Throat: Mucous membranes are moist.  Oropharynx non-erythematous. Neck: No stridor.   Cardiovascular: Tachycardic rate, regular rhythm. Grossly normal heart sounds.  Good peripheral circulation. Respiratory: Normal respiratory effort.  No retractions. Lungs CTAB. Gastrointestinal: Soft and mildly tender to palpation epigastrium without rebound or guarding. No distention. No abdominal bruits. No CVA tenderness. Musculoskeletal: No lower extremity tenderness nor edema.  No  joint effusions. Neurologic:  Normal speech and language. No gross focal neurologic deficits are appreciated.  Skin:  Skin is warm, dry and intact. No rash noted. Psychiatric: Mood and affect are normal. Speech and behavior are normal.  ____________________________________________   LABS (all labs ordered are listed, but only abnormal results are displayed)  Labs Reviewed  COMPREHENSIVE METABOLIC PANEL - Abnormal; Notable for the following components:      Result Value    Sodium 132 (*)    Chloride 96 (*)    Glucose, Bld 104 (*)    BUN 24 (*)    AST 43 (*)    All other components within normal limits  CBC - Abnormal; Notable for the following components:   RBC 3.84 (*)    MCH 34.9 (*)    All other components within normal limits  CBC - Abnormal; Notable for the following components:   RBC 3.40 (*)    HCT 34.3 (*)    MCV 100.9 (*)    MCH 35.3 (*)    All other components within normal limits  CBC - Abnormal; Notable for the following components:   RBC 3.09 (*)    Hemoglobin 10.8 (*)    HCT 30.1 (*)    MCH 35.0 (*)    All other components within normal limits  CBC - Abnormal; Notable for the following components:   RBC 3.12 (*)    Hemoglobin 11.0 (*)    HCT 31.4 (*)    MCV 100.6 (*)    MCH 35.3 (*)    All other components within normal limits  IRON AND TIBC - Abnormal; Notable for the following components:   Iron 212 (*)    Saturation Ratios 81 (*)    All other components within normal limits  VITAMIN B12 - Abnormal; Notable for the following components:   Vitamin B-12 136 (*)    All other components within normal limits  CBC - Abnormal; Notable for the following components:   RBC 2.91 (*)    Hemoglobin 10.2 (*)    HCT 29.3 (*)    MCV 100.7 (*)    MCH 35.1 (*)    All other components within normal limits  BASIC METABOLIC PANEL - Abnormal; Notable for the following components:   Potassium 3.4 (*)    Calcium 8.7 (*)    All other components within normal limits  RESPIRATORY PANEL BY RT PCR (FLU A&B, COVID)  ETHANOL  LIPASE, BLOOD  PROTIME-INR  APTT  PREGNANCY, URINE  FERRITIN  FOLATE  HIV ANTIBODY (ROUTINE TESTING W REFLEX)  CBC  CBC  CBC  TYPE AND SCREEN   ____________________________________________  EKG  ED ECG REPORT I, Keygan Dumond J, the attending physician, personally viewed and interpreted this ECG.   Date: 07/12/2019  EKG Time: 0540  Rate: 138  Rhythm: sinus tachycardia  Axis: Normal  Intervals:none  ST&T  Change: Nonspecific  ____________________________________________  RADIOLOGY  ED MD interpretation: No acute cardiopulmonary process  Official radiology report(s): DG Chest Port 1 View  Result Date: 07/12/2019 CLINICAL DATA:  Vomiting.  Blood in stool. EXAM: PORTABLE CHEST 1 VIEW COMPARISON:  07/04/2008 FINDINGS: Generous lung volumes diaphragm flattening. There is no edema, consolidation, effusion, or pneumothorax. Minimal scarring or atelectasis over the right infrahilar lung. Normal heart size and mediastinal contours. No acute osseous finding. IMPRESSION: 1. No acute finding. 2. Large lung volumes, question risk factors for COPD. Electronically Signed   By: Monte Fantasia M.D.   On: 07/12/2019 07:03  ____________________________________________   PROCEDURES  Procedure(s) performed (including Critical Care):  .1-3 Lead EKG Interpretation Performed by: Irean Hong, MD Authorized by: Irean Hong, MD     Interpretation: abnormal     ECG rate:  125   ECG rate assessment: tachycardic     Rhythm: sinus tachycardia     Ectopy: none     Conduction: normal   Comments:     Patient was placed on the cardiac monitor to monitor for arrhythmia   CRITICAL CARE Performed by: Irean Hong   Total critical care time: 30 minutes  Critical care time was exclusive of separately billable procedures and treating other patients.  Critical care was necessary to treat or prevent imminent or life-threatening deterioration.  Critical care was time spent personally by me on the following activities: development of treatment plan with patient and/or surrogate as well as nursing, discussions with consultants, evaluation of patient's response to treatment, examination of patient, obtaining history from patient or surrogate, ordering and performing treatments and interventions, ordering and review of laboratory studies, ordering and review of radiographic studies, pulse oximetry and re-evaluation  of patient's condition.   ____________________________________________   INITIAL IMPRESSION / ASSESSMENT AND PLAN / ED COURSE  As part of my medical decision making, I reviewed the following data within the electronic MEDICAL RECORD NUMBER Nursing notes reviewed and incorporated, Labs reviewed, EKG interpreted, Old chart reviewed, Radiograph reviewed, Discussed with admitting physician and Notes from prior ED visits     Brandy Garner was evaluated in Emergency Department on 07/13/2019 for the symptoms described in the history of present illness. She was evaluated in the context of the global COVID-19 pandemic, which necessitated consideration that the patient might be at risk for infection with the SARS-CoV-2 virus that causes COVID-19. Institutional protocols and algorithms that pertain to the evaluation of patients at risk for COVID-19 are in a state of rapid change based on information released by regulatory bodies including the CDC and federal and state organizations. These policies and algorithms were followed during the patient's care in the ED.    54 year old female presenting with hematemesis and black stools in the setting of daily EtOH and increased NSAID use. Differential diagnosis includes, but is not limited to, biliary disease (biliary colic, acute cholecystitis, cholangitis, choledocholithiasis, etc), intrathoracic causes for epigastric abdominal pain including ACS, gastritis, duodenitis, pancreatitis, small bowel or large bowel obstruction, abdominal aortic aneurysm, hernia, and ulcer(s).  Initial H/H unremarkable.  Initiating IV fluid resuscitation for tachycardia, place on CIWA protocol, IV Protonix bolus and drip.  Discussed with hospital services for admission.      ____________________________________________   FINAL CLINICAL IMPRESSION(S) / ED DIAGNOSES  Final diagnoses:  Generalized abdominal pain  Hematemesis with nausea  Melena  UGI bleed     ED Discharge Orders     None       Note:  This document was prepared using Dragon voice recognition software and may include unintentional dictation errors.   Irean Hong, MD 07/13/19 [phone]

## 2019-07-12 NOTE — H&P (Signed)
History and Physical    Brandy Garner UXN:[id number] DOB: 08-28-1965 DOA: 07/12/2019  Referring MD/NP/PA:   PCP: Patient, No Pcp Per   Patient coming from:  The patient is coming from home.  At baseline, pt is independent for most of ADL.        Chief Complaint: Hematemesis  HPI: Brandy Garner is a 54 y.o. female with medical history significant of alcohol abuse, tobacco use, who presents with hematemesis  Patient states that she had 2 episodes of hematemesis with coffee-ground material risk and little bit of blood clot.  Her stool is also dark.  She has mild epigastric pain.  No diarrhea.  No fever or chills.  Patient does not have chest pain, shortness breath, cough, fever or chills.  No symptoms of UTI or unilateral weakness.  Patient drinks 6-4 beers daily.  She also use ibuprofen for back pain.  ED Course: pt was found to have hemoglobin 13.4, negative COVID-19 PCR, pending alcohol level, pending lipase, electrolytes renal function okay, blood pressure 171/116, heart rate 98, oxygen sat 98% on room air chest x-ray negative.  Patient is placed on MedSurg bed for observation.  Review of Systems:   General: no fevers, chills, no body weight gain, has fatigue HEENT: no blurry vision, hearing changes or sore throat Respiratory: no dyspnea, coughing, wheezing CV: no chest pain, no palpitations GI: has emesis and epigastric abdominal pain, no diarrhea, constipation GU: no dysuria, burning on urination, increased urinary frequency, hematuria  Ext: no leg edema Neuro: no unilateral weakness, numbness, or tingling, no vision change or hearing loss Skin: no rash, no skin tear. MSK: No muscle spasm, no deformity, no limitation of range of movement in spin Heme: No easy bruising.  Travel history: No recent long distant travel.  Allergy:  Allergies  Allergen Reactions  . Fentanyl Hives and Nausea And Vomiting     When pt had a c-section     Past Medical History:  Diagnosis Date   . Alcohol use   . Tobacco abuse     Past Surgical History:  Procedure Laterality Date  . CESAREAN SECTION      Social History:  reports that she has been smoking. She has never used smokeless tobacco. She reports current alcohol use. She reports that she does not use drugs.  Family History:  Family History  Problem Relation Age of Onset  . Diabetes Mellitus II Father   . Heart disease Father      Prior to Admission medications   Not on File    Physical Exam: Vitals:   07/12/19 0800 07/12/19 0900 07/12/19 1000 07/12/19 1003  BP: (!) 162/84 (!) 156/87 (!) 143/90 (!) 143/90  Pulse: 97 98 98 99  Resp: 11 19 15    SpO2: 100% 97% 100%   Weight:      Height:       General: Not in acute distress HEENT:       Eyes: PERRL, EOMI, no scleral icterus.       ENT: No discharge from the ears and nose, no pharynx injection, no tonsillar enlargement.        Neck: No JVD, no bruit, no mass felt. Heme: No neck lymph node enlargement. Cardiac: S1/S2, RRR, No murmurs, No gallops or rubs. Respiratory: No rales, wheezing, rhonchi or rubs. GI: Soft, nondistended, nontender, no rebound pain, no organomegaly, BS present. GU: No hematuria Ext: No pitting leg edema bilaterally. 2+DP/PT pulse bilaterally. Musculoskeletal: No joint deformities, No joint redness or  warmth, no limitation of ROM in spin. Skin: No rashes.  Neuro: Alert, oriented X3, cranial nerves II-XII grossly intact, moves all extremities normally Psych: Patient is not psychotic, no suicidal or hemocidal ideation.  Labs on Admission: I have personally reviewed following labs and imaging studies  CBC: Recent Labs  Lab 07/12/19 0546 07/12/19 0838  WBC 9.9 8.9  HGB 13.4 12.0  HCT 38.1 34.3*  MCV 99.2 100.9*  PLT 262 230   Basic Metabolic Panel: Recent Labs  Lab 07/12/19 0546  NA 132*  K 4.0  CL 96*  CO2 26  GLUCOSE 104*  BUN 24*  CREATININE 0.64  CALCIUM 9.8   GFR: Estimated Creatinine Clearance: 77.9 mL/min  (by C-G formula based on SCr of 0.64 mg/dL). Liver Function Tests: Recent Labs  Lab 07/12/19 0546  AST 43*  ALT 29  ALKPHOS 94  BILITOT 0.8  PROT 8.1  ALBUMIN 4.3   Recent Labs  Lab 07/12/19 0623  LIPASE 28   No results for input(s): AMMONIA in the last 168 hours. Coagulation Profile: Recent Labs  Lab 07/12/19 0623  INR 1.0   Cardiac Enzymes: No results for input(s): CKTOTAL, CKMB, CKMBINDEX, TROPONINI in the last 168 hours. BNP (last 3 results) No results for input(s): PROBNP in the last 8760 hours. HbA1C: No results for input(s): HGBA1C in the last 72 hours. CBG: No results for input(s): GLUCAP in the last 168 hours. Lipid Profile: No results for input(s): CHOL, HDL, LDLCALC, TRIG, CHOLHDL, LDLDIRECT in the last 72 hours. Thyroid Function Tests: No results for input(s): TSH, T4TOTAL, FREET4, T3FREE, THYROIDAB in the last 72 hours. Anemia Panel: No results for input(s): VITAMINB12, FOLATE, FERRITIN, TIBC, IRON, RETICCTPCT in the last 72 hours. Urine analysis:    Component Value Date/Time   COLORURINE YELLOW 09/28/2013 1651   APPEARANCEUR CLEAR 09/28/2013 1651   LABSPEC 1.005 09/28/2013 1651   PHURINE 6.0 09/28/2013 1651   GLUCOSEU NEGATIVE 09/28/2013 1651   HGBUR NEGATIVE 09/28/2013 1651   BILIRUBINUR NEGATIVE 09/28/2013 1651   KETONESUR NEGATIVE 09/28/2013 1651   PROTEINUR NEGATIVE 09/28/2013 1651   NITRITE NEGATIVE 09/28/2013 1651   LEUKOCYTESUR NEGATIVE 09/28/2013 1651   Sepsis Labs: @LABRCNTIP (procalcitonin:4,lacticidven:4) ) Recent Results (from the past 240 hour(s))  Respiratory Panel by RT PCR (Flu A&B, Covid) - Nasopharyngeal Swab     Status: None   Collection Time: 07/12/19  6:39 AM   Specimen: Nasopharyngeal Swab  Result Value Ref Range Status   SARS Coronavirus 2 by RT PCR NEGATIVE NEGATIVE Final    Comment: (NOTE) SARS-CoV-2 target nucleic acids are NOT DETECTED. The SARS-CoV-2 RNA is generally detectable in upper respiratoy specimens  during the acute phase of infection. The lowest concentration of SARS-CoV-2 viral copies this assay can detect is 131 copies/mL. A negative result does not preclude SARS-Cov-2 infection and should not be used as the sole basis for treatment or other patient management decisions. A negative result may occur with  improper specimen collection/handling, submission of specimen other than nasopharyngeal swab, presence of viral mutation(s) within the areas targeted by this assay, and inadequate number of viral copies (<131 copies/mL). A negative result must be combined with clinical observations, patient history, and epidemiological information. The expected result is Negative. Fact Sheet for Patients:  09/11/19 Fact Sheet for Healthcare Providers:  https://www.moore.com/ This test is not yet ap proved or cleared by the https://www.young.biz/ FDA and  has been authorized for detection and/or diagnosis of SARS-CoV-2 by FDA under an Emergency Use Authorization (EUA). This EUA  will remain  in effect (meaning this test can be used) for the duration of the COVID-19 declaration under Section 564(b)(1) of the Act, 21 U.S.C. section 360bbb-3(b)(1), unless the authorization is terminated or revoked sooner.    Influenza A by PCR NEGATIVE NEGATIVE Final   Influenza B by PCR NEGATIVE NEGATIVE Final    Comment: (NOTE) The Xpert Xpress SARS-CoV-2/FLU/RSV assay is intended as an aid in  the diagnosis of influenza from Nasopharyngeal swab specimens and  should not be used as a sole basis for treatment. Nasal washings and  aspirates are unacceptable for Xpert Xpress SARS-CoV-2/FLU/RSV  testing. Fact Sheet for Patients: PinkCheek.be Fact Sheet for Healthcare Providers: GravelBags.it This test is not yet approved or cleared by the [place] FDA and  has been authorized for detection and/or diagnosis of  SARS-CoV-2 by  FDA under an Emergency Use Authorization (EUA). This EUA will remain  in effect (meaning this test can be used) for the duration of the  Covid-19 declaration under Section 564(b)(1) of the Act, 21  U.S.C. section 360bbb-3(b)(1), unless the authorization is  terminated or revoked. Performed at [facility], [address], [place], [place] [address]      Radiological Exams on Admission: DG Chest [facility] 1 View  Result Date: 07/12/2019 CLINICAL DATA:  Vomiting.  Blood in stool. EXAM: PORTABLE CHEST 1 VIEW COMPARISON:  07/04/2008 FINDINGS: Generous lung volumes diaphragm flattening. There is no edema, consolidation, effusion, or pneumothorax. Minimal scarring or atelectasis over the right infrahilar lung. Normal heart size and mediastinal contours. No acute osseous finding. IMPRESSION: 1. No acute finding. 2. Large lung volumes, question risk factors for COPD. Electronically Signed   By: Monte Fantasia M.D.   On: 07/12/2019 07:03     EKG: Independently reviewed.  Sinus tachycardia, QTC 463, low voltage, LAE  Assessment/Plan Principal Problem:   GIB (gastrointestinal bleeding) Active Problems:   Alcohol use   Tobacco abuse   GIB (gastrointestinal bleeding): Possibly due to upper GI bleeding given alcohol use and ibuprofen use.  Hemoglobin 13.4. Dr. Marius Ditch of GI is consulted. Hemodynamically stable  - will place in med-surg bed obs - GI consulted by Ed, will follow up recommendations - NPO for possible EGD - IVF: 2L NS bolus, then at 755 mL/hr - Start IV pantoprazole gtt - Zofran IV for nausea - Avoid NSAIDs and SQ heparin - Maintain IV access (2 large bore IVs if possible). - Monitor closely and follow q6h cbc, transfuse as necessary, if Hgb<7.0 - LaB: INR, PTT and type screen  Tobacco abuse and Alcohol abuse: -Did counseling about importance of quitting smoking -Nicotine patch -Did counseling about the importance of quitting drinking -CIWA protocol    DVT ppx: SCD Code Status: Full code Family Communication: not done, no family member is at bed side.  Disposition Plan:  Anticipate discharge back to previous home environment Consults called: Dr. Marius Ditch of GI Admission status: Med-surg bed for obs  Date of Service 07/12/2019    [facility]   If 7PM-7AM, please contact night-coverage [email or web address] 07/12/2019, 11:04 AM

## 2019-07-12 NOTE — Consult Note (Signed)
Cephas Darby, MD [address]  [place]  [place], [place] [address]  Main: [phone]  Fax: [phone] Pager: [phone]   Consultation  Referring Provider:     No ref. provider found Primary Care Physician:  Patient, No Pcp Per Primary Gastroenterologist: Unassigned         Reason for Consultation:     Hematemesis, melena  Date of Admission:  07/12/2019 Date of Consultation:  07/12/2019         HPI:   Brandy Garner is a 53 y.o. female with history of alcohol use, chronic tobacco use presented with 1 episode of hematemesis and black tarry stool that started earlier this morning.  Patient reports that she had nausea followed by emesis with small clots.  She also noticed 1 black tarry stool.  Patient's hemoglobin dropped from 13.4 baseline to 10.8.  She had mildly elevated BUN 24, normal creatinine.  LFTs revealed AST 43, ALT 29.  Serum lipase normal.  She started on pantoprazole drip and GI is consulted for further evaluation.  Patient is hemodynamically stable, she denies epigastric pain.  She takes Tums as needed for reflux symptoms.  She acknowledges drinking alcohol daily, up to 6 beers per day.   NSAIDs: Occasional use of ibuprofen for back pain  Antiplts/Anticoagulants/Anti thrombotics: None  GI Procedures: None  Past Medical History:  Diagnosis Date  . Alcohol use   . Tobacco abuse     Past Surgical History:  Procedure Laterality Date  . CESAREAN SECTION      Prior to Admission medications   Medication Sig Start Date End Date Taking? Authorizing Provider  ibuprofen (ADVIL) 200 MG tablet Take 200 mg by mouth every 6 (six) hours as needed.   Yes [provider]    Current Facility-Administered Medications:  .  acetaminophen (TYLENOL) tablet 650 mg, 650 mg, Oral, Q6H PRN, Ivor Costa, MD .  LORazepam (ATIVAN) injection 0-4 mg, 0-4 mg, Intravenous, Q6H, Sung, Gretchen Short, MD .  nicotine (NICODERM CQ - dosed in mg/24 hours) patch 21 mg, 21 mg,  Transdermal, Daily, Ivor Costa, MD, 21 mg at 07/12/19 1010 .  ondansetron (ZOFRAN) injection 4 mg, 4 mg, Intravenous, Q8H PRN, Ivor Costa, MD .  pantoprazole (PROTONIX) 80 mg in sodium chloride 0.9 % 100 mL (0.8 mg/mL) infusion, 8 mg/hr, Intravenous, Continuous, Paulette Blanch, MD, Last Rate: 10 mL/hr at 07/12/19 0656, 8 mg/hr at 07/12/19 0656 .  thiamine (B-1) injection 100 mg, 100 mg, Intravenous, Daily, Beather Arbour, Jade J, MD, 100 mg at 07/12/19 1008  Current Outpatient Medications:  .  ibuprofen (ADVIL) 200 MG tablet, Take 200 mg by mouth every 6 (six) hours as needed., Disp: , Rfl:   Family History  Problem Relation Age of Onset  . Diabetes Mellitus II Father   . Heart disease Father      Social History   Tobacco Use  . Smoking status: Current Every Day Smoker  . Smokeless tobacco: Never Used  Substance Use Topics  . Alcohol use: Yes  . Drug use: Never    Allergies as of 07/12/2019 - Review Complete 07/12/2019  Allergen Reaction Noted  . Fentanyl Hives and Nausea And Vomiting 07/12/2019    Review of Systems:    All systems reviewed and negative except where noted in HPI.   Physical Exam:  Vital signs in last 24 hours: Pulse Rate:  [97-130] 99 (04/12 1003) Resp:  [9-19] 15 (04/12 1000) BP: (143-171)/(84-116) 143/90 (04/12 1003) SpO2:  [  97 %-100 %] 100 % (04/12 1000) Weight:  [70.8 kg] 70.8 kg (04/12 0540)   General:   Pleasant, cooperative in NAD Head:  Normocephalic and atraumatic. Eyes:   No icterus.   Conjunctiva pink. PERRLA. Ears:  Normal auditory acuity. Neck:  Supple; no masses or thyroidomegaly Lungs: Respirations even and unlabored. Lungs clear to auscultation bilaterally.   No wheezes, crackles, or rhonchi.  Heart:  Regular rate and rhythm;  Without murmur, clicks, rubs or gallops Abdomen:  Soft, nondistended, nontender. Normal bowel sounds. No appreciable masses or hepatomegaly.  No rebound or guarding.  Rectal:  Not performed. Msk:  Symmetrical without gross  deformities.  Strength normal Extremities:  Without edema, cyanosis or clubbing. Neurologic:  Alert and oriented x3;  grossly normal neurologically. Skin:  Intact without significant lesions or rashes. Psych:  Alert and cooperative. Normal affect.  LAB RESULTS: CBC Latest Ref Rng & Units 07/12/2019 07/12/2019 07/12/2019  WBC 4.0 - 10.5 K/uL 6.4 8.9 9.9  Hemoglobin 12.0 - 15.0 g/dL 10.8(L) 12.0 13.4  Hematocrit 36.0 - 46.0 % 30.1(L) 34.3(L) 38.1  Platelets 150 - 400 K/uL 224 230 262    BMET BMP Latest Ref Rng & Units 07/12/2019  Glucose 70 - 99 mg/dL 104(H)  BUN 6 - 20 mg/dL 24(H)  Creatinine 0.44 - 1.00 mg/dL 0.64  Sodium 135 - 145 mmol/L 132(L)  Potassium 3.5 - 5.1 mmol/L 4.0  Chloride 98 - 111 mmol/L 96(L)  CO2 22 - 32 mmol/L 26  Calcium 8.9 - 10.3 mg/dL 9.8    LFT Hepatic Function Latest Ref Rng & Units 07/12/2019  Total Protein 6.5 - 8.1 g/dL 8.1  Albumin 3.5 - 5.0 g/dL 4.3  AST 15 - 41 U/L 43(H)  ALT 0 - 44 U/L 29  Alk Phosphatase 38 - 126 U/L 94  Total Bilirubin 0.3 - 1.2 mg/dL 0.8     STUDIES: DG Chest Port 1 View  Result Date: 07/12/2019 CLINICAL DATA:  Vomiting.  Blood in stool. EXAM: PORTABLE CHEST 1 VIEW COMPARISON:  07/04/2008 FINDINGS: Generous lung volumes diaphragm flattening. There is no edema, consolidation, effusion, or pneumothorax. Minimal scarring or atelectasis over the right infrahilar lung. Normal heart size and mediastinal contours. No acute osseous finding. IMPRESSION: 1. No acute finding. 2. Large lung volumes, question risk factors for COPD. Electronically Signed   By: Monte Fantasia M.D.   On: 07/12/2019 07:03      Impression / Plan:   Brandy Garner is a 54 y.o. female with history of alcohol and tobacco use presents with hematemesis and melena with acute anemia  Hematemesis, melena: No evidence of cirrhosis Differentials include peptic ulcer disease or erosive esophagitis or alcoholic gastritis or less likely small bowel or large bowel  etiology Continue pantoprazole drip Okay with clear liquid diet N.p.o. past midnight Perform EGD tomorrow, if EGD is unremarkable, recommend colonoscopy +/- VCE Complete abstinence from alcohol use Monitor CBC closely and transfuse as needed  Thank you for involving me in the care of this patient.  GI will follow along with you    LOS: 1 day   Sherri Sear, MD  07/12/2019, 3:42 PM   Note: This dictation was prepared with Dragon dictation along with smaller phrase technology. Any transcriptional errors that result from this process are unintentional.

## 2019-07-12 NOTE — Plan of Care (Signed)
Patient admitted to floor, oriented to room and call bell. Patient independent ambulatory to bathroom.  Denies Nausea or vomiting but states has had tarry stool since being admitted to hospital.  Instructed patient to notify RN if happens again, patient verbalized understanding. PIV infusing NS, called for protonix infusion.

## 2019-07-12 NOTE — ED Notes (Signed)
Pt given menu and phone to call for dinner tray.

## 2019-07-13 ENCOUNTER — Encounter
Admission: EM | Disposition: A | Discharge status: Home / Self Care | Payer: Self-pay | Source: Home / Self Care | Attending: Internal Medicine

## 2019-07-13 ENCOUNTER — Observation Stay: Payer: BLUE CROSS/BLUE SHIELD | Admitting: Anesthesiology

## 2019-07-13 ENCOUNTER — Encounter: Payer: Self-pay | Admitting: Internal Medicine

## 2019-07-13 DIAGNOSIS — K921 Melena: Secondary | ICD-10-CM | POA: Diagnosis not present

## 2019-07-13 DIAGNOSIS — Z7289 Other problems related to lifestyle: Secondary | ICD-10-CM | POA: Diagnosis not present

## 2019-07-13 DIAGNOSIS — K920 Hematemesis: Secondary | ICD-10-CM | POA: Diagnosis not present

## 2019-07-13 HISTORY — PX: ESOPHAGOGASTRODUODENOSCOPY: SHX5428

## 2019-07-13 LAB — CBC
HCT: 29.3 % — ABNORMAL LOW (ref 36.0–46.0)
HCT: 28.6 % — ABNORMAL LOW (ref 36.0–46.0)
HCT: 28.9 % — ABNORMAL LOW (ref 36.0–46.0)
HCT: 28.7 % — ABNORMAL LOW (ref 36.0–46.0)
Hemoglobin: 10.2 g/dL — ABNORMAL LOW (ref 12.0–15.0)
Hemoglobin: 10.1 g/dL — ABNORMAL LOW (ref 12.0–15.0)
Hemoglobin: 10 g/dL — ABNORMAL LOW (ref 12.0–15.0)
Hemoglobin: 9.5 g/dL — ABNORMAL LOW (ref 12.0–15.0)
MCH: 35.1 pg — ABNORMAL HIGH (ref 26.0–34.0)
MCH: 34.7 pg — ABNORMAL HIGH (ref 26.0–34.0)
MCH: 34.6 pg — ABNORMAL HIGH (ref 26.0–34.0)
MCH: 34.4 pg — ABNORMAL HIGH (ref 26.0–34.0)
MCHC: 34.8 g/dL (ref 30.0–36.0)
MCHC: 35.3 g/dL (ref 30.0–36.0)
MCHC: 34.6 g/dL (ref 30.0–36.0)
MCHC: 33.1 g/dL (ref 30.0–36.0)
MCV: 100.7 fL — ABNORMAL HIGH (ref 80.0–100.0)
MCV: 98.3 fL (ref 80.0–100.0)
MCV: 100 fL (ref 80.0–100.0)
MCV: 104 fL — ABNORMAL HIGH (ref 80.0–100.0)
Platelets: 206 10*3/uL (ref 150–400)
Platelets: 193 10*3/uL (ref 150–400)
Platelets: 198 10*3/uL (ref 150–400)
Platelets: 186 10*3/uL (ref 150–400)
RBC: 2.91 MIL/uL — ABNORMAL LOW (ref 3.87–5.11)
RBC: 2.91 MIL/uL — ABNORMAL LOW (ref 3.87–5.11)
RBC: 2.89 MIL/uL — ABNORMAL LOW (ref 3.87–5.11)
RBC: 2.76 MIL/uL — ABNORMAL LOW (ref 3.87–5.11)
RDW: 13 % (ref 11.5–15.5)
RDW: 13.1 % (ref 11.5–15.5)
RDW: 13.1 % (ref 11.5–15.5)
RDW: 13.2 % (ref 11.5–15.5)
WBC: 7.1 10*3/uL (ref 4.0–10.5)
WBC: 5.6 10*3/uL (ref 4.0–10.5)
WBC: 6.2 10*3/uL (ref 4.0–10.5)
WBC: 7.1 10*3/uL (ref 4.0–10.5)
nRBC: 0 % (ref 0.0–0.2)
nRBC: 0 % (ref 0.0–0.2)
nRBC: 0 % (ref 0.0–0.2)
nRBC: 0 % (ref 0.0–0.2)

## 2019-07-13 LAB — GLUCOSE, CAPILLARY: Glucose-Capillary: 82 mg/dL (ref 70–99)

## 2019-07-13 LAB — BASIC METABOLIC PANEL
Anion gap: 9 (ref 5–15)
BUN: 15 mg/dL (ref 6–20)
CO2: 22 mmol/L (ref 22–32)
Calcium: 8.7 mg/dL — ABNORMAL LOW (ref 8.9–10.3)
Chloride: 104 mmol/L (ref 98–111)
Creatinine, Ser: 0.54 mg/dL (ref 0.44–1.00)
GFR calc Af Amer: >60 mL/min (ref >60)
GFR calc non Af Amer: >60 mL/min (ref >60)
Glucose, Bld: 89 mg/dL (ref 70–99)
Potassium: 3.4 mmol/L — ABNORMAL LOW (ref 3.5–5.1)
Sodium: 135 mmol/L (ref 135–145)

## 2019-07-13 LAB — HIV ANTIBODY (ROUTINE TESTING W REFLEX): HIV Screen 4th Generation wRfx: NONREACTIVE

## 2019-07-13 SURGERY — EGD (ESOPHAGOGASTRODUODENOSCOPY)
Anesthesia: General

## 2019-07-13 MED ORDER — PANTOPRAZOLE SODIUM 40 MG PO TBEC: 40.0000 mg | DELAYED_RELEASE_TABLET | Freq: Two times a day (BID) | ORAL | Status: DC

## 2019-07-13 MED ORDER — MIDAZOLAM HCL 2 MG/2ML IJ SOLN
INTRAMUSCULAR | Status: AC
  Filled 2019-07-13: qty 2

## 2019-07-13 MED ORDER — MIDAZOLAM HCL 2 MG/2ML IJ SOLN
INTRAMUSCULAR | Status: DC | PRN
  Administered 2019-07-13 (×2): 1 mg via INTRAVENOUS

## 2019-07-13 MED ORDER — SUCRALFATE 1 G PO TABS: 1.0000 g | ORAL_TABLET | Freq: Four times a day (QID) | ORAL | 0 refills | Status: DC

## 2019-07-13 MED ORDER — FENTANYL CITRATE (PF) 100 MCG/2ML IJ SOLN
INTRAMUSCULAR | Status: DC | PRN
  Administered 2019-07-13: 50 ug via INTRAVENOUS

## 2019-07-13 MED ORDER — BUTAMBEN-TETRACAINE-BENZOCAINE 2-2-14 % EX AERO
INHALATION_SPRAY | CUTANEOUS | Status: AC
  Filled 2019-07-13: qty 5

## 2019-07-13 MED ORDER — PROPOFOL 500 MG/50ML IV EMUL
INTRAVENOUS | Status: DC | PRN
  Administered 2019-07-13: 100 ug/kg/min via INTRAVENOUS

## 2019-07-13 MED ORDER — PANTOPRAZOLE SODIUM 40 MG PO TBEC: 40.0000 mg | DELAYED_RELEASE_TABLET | Freq: Two times a day (BID) | ORAL | 0 refills | Status: DC

## 2019-07-13 MED ORDER — LIDOCAINE HCL (CARDIAC) PF 100 MG/5ML IV SOSY
PREFILLED_SYRINGE | INTRAVENOUS | Status: DC | PRN
  Administered 2019-07-13: 50 mg via INTRAVENOUS

## 2019-07-13 MED ORDER — LIDOCAINE HCL (PF) 2 % IJ SOLN
INTRAMUSCULAR | Status: AC
  Filled 2019-07-13: qty 5

## 2019-07-13 MED ORDER — SODIUM CHLORIDE 0.9 % IV SOLN: INTRAVENOUS | Status: DC

## 2019-07-13 MED ORDER — PROPOFOL 500 MG/50ML IV EMUL
INTRAVENOUS | Status: AC
  Filled 2019-07-13: qty 50

## 2019-07-13 MED ORDER — FENTANYL CITRATE (PF) 100 MCG/2ML IJ SOLN
INTRAMUSCULAR | Status: AC
  Filled 2019-07-13: qty 2

## 2019-07-13 NOTE — Anesthesia Postprocedure Evaluation (Signed)
Anesthesia Post Note  Patient: Brandy Garner  Procedure(s) Performed: ESOPHAGOGASTRODUODENOSCOPY (EGD) (N/A )  Patient location during evaluation: Endoscopy Anesthesia Type: General Level of consciousness: awake and alert Pain management: pain level controlled Vital Signs Assessment: post-procedure vital signs reviewed and stable Respiratory status: spontaneous breathing, nonlabored ventilation, respiratory function stable and patient connected to nasal cannula oxygen Cardiovascular status: blood pressure returned to baseline and stable Postop Assessment: no apparent nausea or vomiting Anesthetic complications: no     Last Vitals:  Vitals:   07/13/19 1136 07/13/19 1146  BP: 132/72 (!) 141/78  Pulse:  82  Resp:  15  Temp:    SpO2:  100%    Last Pain:  Vitals:   07/13/19 1146  TempSrc:   PainSc: 0-No pain                 Lenard Simmer

## 2019-07-13 NOTE — Op Note (Signed)
[facility] Gastroenterology Patient Name: Brandy Garner Procedure Date: 07/13/2019 10:58 AM MRN: [id number] Account #: [id number] Date of Birth: Jan 28, 1966 Admit Type: Inpatient Age: 54 Room: [facility] ENDO ROOM 1 Gender: Female Note Status: Finalized Procedure:             Upper GI endoscopy Indications:           Acute post hemorrhagic anemia, Hematemesis, Melena Providers:             Lin Landsman MD, MD Referring MD:          No Local Md, MD (Referring MD) Medicines:             Monitored Anesthesia Care Complications:         No immediate complications. Estimated blood loss: None. Procedure:             Pre-Anesthesia Assessment:                        - Prior to the procedure, a History and Physical was                         performed, and patient medications and allergies were                         reviewed. The patient is competent. The risks and                         benefits of the procedure and the sedation options and                         risks were discussed with the patient. All questions                         were answered and informed consent was obtained.                         Patient identification and proposed procedure were                         verified by the physician, the nurse, the                         anesthesiologist, the anesthetist and the technician                         in the pre-procedure area in the procedure room in the                         endoscopy suite. Mental Status Examination: alert and                         oriented. Airway Examination: normal oropharyngeal                         airway and neck mobility. Respiratory Examination:                         clear to auscultation. CV Examination: normal.  Prophylactic Antibiotics: The patient does not require                         prophylactic antibiotics. Prior Anticoagulants: The                         patient has taken no  previous anticoagulant or                         antiplatelet agents. ASA Grade Assessment: II - A                         patient with mild systemic disease. After reviewing                         the risks and benefits, the patient was deemed in                         satisfactory condition to undergo the procedure. The                         anesthesia plan was to use monitored anesthesia care                         (MAC). Immediately prior to administration of                         medications, the patient was re-assessed for adequacy                         to receive sedatives. The heart rate, respiratory                         rate, oxygen saturations, blood pressure, adequacy of                         pulmonary ventilation, and response to care were                         monitored throughout the procedure. The physical                         status of the patient was re-assessed after the                         procedure.                        After obtaining informed consent, the endoscope was                         passed under direct vision. Throughout the procedure,                         the patient's blood pressure, pulse, and oxygen                         saturations were monitored continuously. The Endoscope  was introduced through the mouth, and advanced to the                         second part of duodenum. The upper GI endoscopy was                         accomplished without difficulty. The patient tolerated                         the procedure well. Findings:      Diffuse moderate inflammation characterized by congestion (edema) and       erythema was found in the duodenal bulb.      The second portion of the duodenum was normal.      Two non-bleeding superficial gastric ulcers with a clean ulcer base       (Forrest Class III) were found in the gastric antrum. The largest lesion       was 6 mm in largest dimension.       Multiple dispersed diminutive erosions with no bleeding and no stigmata       of recent bleeding were found in the gastric antrum. Biopsies were taken       with a cold forceps for Helicobacter pylori testing.      The cardia and gastric fundus were normal on retroflexion.      A small hiatal hernia was present.      The gastroesophageal junction and examined esophagus were normal. Impression:            - Duodenitis.                        - Normal second portion of the duodenum.                        - Non-bleeding gastric ulcers with a clean ulcer base                         (Forrest Class III).                        - Erosive gastropathy with no bleeding and no stigmata                         of recent bleeding. Biopsied.                        - Small hiatal hernia.                        - Normal gastroesophageal junction and esophagus. Recommendation:        - Return patient to hospital ward for possible                         discharge same day.                        - Resume regular diet today.                        - Continue present medications.                        -  Await pathology results.                        - Use Prilosec (omeprazole) 40 mg PO BID for 1 month,                         then daily for 2 months.                        - Avoid alcohol use                        - No ibuprofen, naproxen, or other non-steroidal                         anti-inflammatory drugs.                        - Return to GI clinic in 1 month. Procedure Code(s):     --- Professional ---                        [phone], Esophagogastroduodenoscopy, flexible,                         transoral; with biopsy, single or multiple Diagnosis Code(s):     --- Professional ---                        K29.80, Duodenitis without bleeding                        K25.9, Gastric ulcer, unspecified as acute or chronic,                         without hemorrhage or perforation                         K31.89, Other diseases of stomach and duodenum                        K44.9, Diaphragmatic hernia without obstruction or                         gangrene                        D62, Acute posthemorrhagic anemia                        K92.0, Hematemesis                        K92.1, Melena (includes Hematochezia) CPT copyright 2019 [employer] Association. All rights reserved. The codes documented in this report are preliminary and upon coder review may  be revised to meet current compliance requirements. Dr. Ulyess Mort Lin Landsman MD, MD 07/13/2019 11:18:39 AM This report has been signed electronically. Number of Addenda: 0 Note Initiated On: 07/13/2019 10:58 AM Estimated Blood Loss:  Estimated blood loss: none.      [facility]

## 2019-07-13 NOTE — Transfer of Care (Signed)
Immediate Anesthesia Transfer of Care Note  Patient: Brandy Garner  Procedure(s) Performed: ESOPHAGOGASTRODUODENOSCOPY (EGD) (N/A )  Patient Location: PACU  Anesthesia Type:General  Level of Consciousness: awake and sedated  Airway & Oxygen Therapy: Patient Spontanous Breathing and Patient connected to nasal cannula oxygen  Post-op Assessment: Report given to RN  Post vital signs: Reviewed  Last Vitals:  Vitals Value Taken Time  BP    Temp    Pulse    Resp    SpO2      Last Pain:  Vitals:   07/13/19 1042  TempSrc: Tympanic  PainSc:          Complications: No apparent anesthesia complications

## 2019-07-13 NOTE — Discharge Summary (Signed)
Physician Discharge Summary  Patient ID: Brandy Garner MRN: [id number] DOB/AGE: 1965-12-19 54 y.o.  Admit date: 07/12/2019 Discharge date: 07/13/2019  Admission Diagnoses: GI bleed Discharge Diagnoses:  Principal Problem:   GIB (gastrointestinal bleeding) Active Problems:   Alcohol use   Tobacco abuse Gastric ulcers  Discharged Condition: Stable  Hospital Course:  Patient is a 54 year old female with history of alcohol abuse who presents to the hospital with coffee-ground hematemesis. Patient woke up in the morning feeling nauseated, the second time she vomited up some coffee-ground material.  Over the course of yesterday, she had a total of 2 episodes.  She had a bowel movement later that day, the stool looks dark in color.  But no diarrhea.  She has some chronic back pain, she is not sure she had any abdominal pain.  Patient also taking ibuprofen 400 mg daily for chronic back pain. Upon arrival in the emergency room, her hemoglobin still stable.  She was placed on Protonix drip.  EGD showed gastric ulcers with duodenitis.  Patient no longer has any GI bleed, hemoglobin stable.  GI has cleared the patient for discharge, she will be treated with Protonix 40 mg twice a day.  She will follow up with GI in 2 weeks.   Consults: Gastroenterology  Significant Diagnostic Studies:  EGD showed gastric ulcer with duodenitis.  Treatments: PPI  Discharge Exam: Blood pressure (!) 141/78, pulse 82, temperature 98 F (36.7 C), temperature source Temporal, resp. rate 15, height 5\' 6"  (1.676 m), weight 70.8 kg, SpO2 100 %. General appearance: alert and no distress Resp: clear to auscultation bilaterally Cardio: regular rate and rhythm, S1, S2 normal, no murmur, click, rub or gallop Extremities: extremities normal, atraumatic, no cyanosis or edema Neurologic: Alert and oriented X 3, normal strength and tone. Normal symmetric reflexes. Normal coordination and gait Abd: soft, mild tenderness in  RUQ Disposition: Discharge disposition: 01-Home or Self Care       Discharge Instructions    Diet - low sodium heart healthy   Complete by: As directed    Increase activity slowly   Complete by: As directed      Allergies as of 07/13/2019      Reactions   Fentanyl Hives, Nausea And Vomiting    When pt had a c-section       Medication List    STOP taking these medications   ibuprofen 200 MG tablet Commonly known as: ADVIL     TAKE these medications   pantoprazole 40 MG tablet Commonly known as: PROTONIX Take 1 tablet (40 mg total) by mouth 2 (two) times daily.   sucralfate 1 g tablet Commonly known as: Carafate Take 1 tablet (1 g total) by mouth 4 (four) times daily.      Follow-up Information    07/15/2019, MD Follow up in 2 week(s).   Specialty: Gastroenterology Contact information: [address] [place] [phone]           Signed: [phone] 07/13/2019, 6:09 PM

## 2019-07-13 NOTE — Anesthesia Preprocedure Evaluation (Signed)
Anesthesia Evaluation  Patient identified by MRN, date of birth, ID band Patient awake    Reviewed: Allergy & Precautions, H&P , NPO status , Patient's Chart, lab work & pertinent test results, reviewed documented beta blocker date and time   History of Anesthesia Complications Negative for: history of anesthetic complications  Airway Mallampati: III  TM Distance: >3 FB Neck ROM: full    Dental  (+) Dental Advidsory Given, Caps, Missing, Teeth Intact   Pulmonary neg pulmonary ROS, Current Smoker,    Pulmonary exam normal breath sounds clear to auscultation       Cardiovascular Exercise Tolerance: Good negative cardio ROS Normal cardiovascular exam Rhythm:regular Rate:Normal     Neuro/Psych negative neurological ROS  negative psych ROS   GI/Hepatic negative GI ROS, (+)     substance abuse  alcohol use,   Endo/Other  negative endocrine ROS  Renal/GU negative Renal ROS  negative genitourinary   Musculoskeletal   Abdominal   Peds  Hematology  (+) Blood dyscrasia, anemia ,   Anesthesia Other Findings Past Medical History: No date: Alcohol use No date: Diabetes mellitus without complication (HCC) No date: Tobacco abuse   Reproductive/Obstetrics negative OB ROS                             Anesthesia Physical Anesthesia Plan  ASA: II  Anesthesia Plan: General   Post-op Pain Management:    Induction: Intravenous  PONV Risk Score and Plan: 2 and Propofol infusion and TIVA  Airway Management Planned: Natural Airway and Nasal Cannula  Additional Equipment:   Intra-op Plan:   Post-operative Plan:   Informed Consent: I have reviewed the patients History and Physical, chart, labs and discussed the procedure including the risks, benefits and alternatives for the proposed anesthesia with the patient or authorized representative who has indicated his/her understanding and acceptance.      Dental Advisory Given  Plan Discussed with: Anesthesiologist, CRNA and Surgeon  Anesthesia Plan Comments:         Anesthesia Quick Evaluation

## 2019-07-13 NOTE — Plan of Care (Signed)
Discharge order received. Patient mental status is at baseline. Vital signs stable . No signs of acute distress. Discharge instructions given. Patient verbalized understanding. No other issues noted at this time.   

## 2019-07-13 NOTE — Progress Notes (Signed)
PROGRESS NOTE    Brandy Garner  RFF:[id number] DOB: November 14, 1965 DOA: 07/12/2019 PCP: Patient, No Pcp Per (Confirm with patient/family/NH records and if not entered, this HAS to be entered at [facility] point of entry. "No PCP" if truly none.)   Brief Narrative: (Start on day 1 of progress note - keep it brief and live) Patient is a 54 year old female with history of alcohol abuse who presents to the hospital with coffee-ground hematemesis. Patient woke up in the morning feeling nauseated, the second time she vomited up some coffee-ground material.  Over the course of yesterday, she had a total of 2 episodes.  She had a bowel movement later that day, the stool looks dark in color.  But no diarrhea.  She has some chronic back pain, she is not sure she had any abdominal pain.  Patient also taking ibuprofen 400 mg daily for chronic back pain. Upon arrival in the emergency room, her hemoglobin still stable.  She was placed on Protonix drip.  EGD scheduled.   Assessment & Plan:   #1.  Upper GI bleed with coffee-ground hematemesis.   Patient does not have a significant vomiting before hematemesis, unlikely from esophageal source.  Physical examination showed tenderness in the right upper quadrant, possibility of a duodenal ulcer.  Continue PPI. schedule EGD for today. Continue monitor hemoglobin until stable.  2.  Alcohol abuse. No evidence of withdrawal.  Continue thiamine folic acid.  Continue CIWA protocol.   DVT prophylaxis: SCDs Code Status: Full Family Communication: Discussed with patient, all questions answered. Disposition Plan:  . Patient came from: Home            . Anticipated d/c place: Home . Barriers to d/c OR conditions which need to be met to effect a safe d/c:   Consultants:   Gastroenterology  Procedures: EGD scheduled for today Antimicrobials: None  Subjective: Patient feels well today.  No additional nausea or vomiting.  No diarrhea.  No abdominal pain. She has some  chronic back pain. Denies any short of breath or cough.  No Chest pain.  Objective: Vitals:   07/12/19 1857 07/12/19 1958 07/13/19 0007 07/13/19 0438  BP:  (!) 141/78 115/75 128/75  Pulse: 85 86 78 79  Resp:  _0 Temp: 99.3 F (37.4 C) 98.8 F (37.1 C) 98.1 F (36.7 C) 98 F (36.7 C)  TempSrc: Oral Oral Oral Axillary  SpO2: 99% 99% 100% 98%  Weight:      Height:        Intake/Output Summary (Last 24 hours) at 07/13/2019 0952 Last data filed at 07/13/2019 0926 Gross per 24 hour  Intake 2268.49 ml  Output 400 ml  Net 1868.49 ml   Filed Weights   07/12/19 0540  Weight: 70.8 kg    Examination:  General exam: Appears calm and comfortable  Respiratory system: Clear to auscultation. Respiratory effort normal. Cardiovascular system: S1 & S2 heard, RRR. No JVD, murmurs, rubs, gallops or clicks. No pedal edema. Gastrointestinal system: Abdomen is nondistended, soft and nontender. No organomegaly or masses felt. Normal bowel sounds heard. Central nervous system: Alert and oriented. No focal neurological deficits. Extremities: Symmetric 5 x 5 power. Skin: No rashes, lesions or ulcers Psychiatry: Judgement and insight appear normal. Mood & affect appropriate.     Data Reviewed: I have personally reviewed following labs and imaging studies  CBC: Recent Labs  Lab 07/12/19 0838 07/12/19 1421 07/12/19 1909 07/13/19 0117 07/13/19 0704  WBC 8.9 6.4 7.4 7.1 5.6  HGB 12.0 10.8* 11.0* 10.2* 10.1*  HCT 34.3* 30.1* 31.4* 29.3* 28.6*  MCV 100.9* 97.4 100.6* 100.7* 98.3  PLT 230 224 221 206 527   Basic Metabolic Panel: Recent Labs  Lab 07/12/19 0546 07/13/19 0117  NA 132* 135  K 4.0 3.4*  CL 96* 104  CO2 26 22  GLUCOSE 104* 89  BUN 24* 15  CREATININE 0.64 0.54  CALCIUM 9.8 8.7*   GFR: Estimated Creatinine Clearance: 77.9 mL/min (by C-G formula based on SCr of 0.54 mg/dL). Liver Function Tests: Recent Labs  Lab 07/12/19 0546  AST 43*  ALT 29  ALKPHOS 94    BILITOT 0.8  PROT 8.1  ALBUMIN 4.3   Recent Labs  Lab 07/12/19 0623  LIPASE 28   No results for input(s): AMMONIA in the last 168 hours. Coagulation Profile: Recent Labs  Lab 07/12/19 0623  INR 1.0   Cardiac Enzymes: No results for input(s): CKTOTAL, CKMB, CKMBINDEX, TROPONINI in the last 168 hours. BNP (last 3 results) No results for input(s): PROBNP in the last 8760 hours. HbA1C: No results for input(s): HGBA1C in the last 72 hours. CBG: Recent Labs  Lab 07/13/19 0747  GLUCAP 82   Lipid Profile: No results for input(s): CHOL, HDL, LDLCALC, TRIG, CHOLHDL, LDLDIRECT in the last 72 hours. Thyroid Function Tests: No results for input(s): TSH, T4TOTAL, FREET4, T3FREE, THYROIDAB in the last 72 hours. Anemia Panel: Recent Labs    07/12/19 0546 07/12/19 1421  VITAMINB12 136*  --   FOLATE  --  7.3  FERRITIN  --  116  TIBC  --  260  IRON  --  212*   Sepsis Labs: No results for input(s): PROCALCITON, LATICACIDVEN in the last 168 hours.  Recent Results (from the past 240 hour(s))  Respiratory Panel by RT PCR (Flu A&B, Covid) - Nasopharyngeal Swab     Status: None   Collection Time: 07/12/19  6:39 AM   Specimen: Nasopharyngeal Swab  Result Value Ref Range Status   SARS Coronavirus 2 by RT PCR NEGATIVE NEGATIVE Final    Comment: (NOTE) SARS-CoV-2 target nucleic acids are NOT DETECTED. The SARS-CoV-2 RNA is generally detectable in upper respiratoy specimens during the acute phase of infection. The lowest concentration of SARS-CoV-2 viral copies this assay can detect is 131 copies/mL. A negative result does not preclude SARS-Cov-2 infection and should not be used as the sole basis for treatment or other patient management decisions. A negative result may occur with  improper specimen collection/handling, submission of specimen other than nasopharyngeal swab, presence of viral mutation(s) within the areas targeted by this assay, and inadequate number of viral  copies (<131 copies/mL). A negative result must be combined with clinical observations, patient history, and epidemiological information. The expected result is Negative. Fact Sheet for Patients:  PinkCheek.be Fact Sheet for Healthcare Providers:  GravelBags.it This test is not yet ap proved or cleared by the [place] FDA and  has been authorized for detection and/or diagnosis of SARS-CoV-2 by FDA under an Emergency Use Authorization (EUA). This EUA will remain  in effect (meaning this test can be used) for the duration of the COVID-19 declaration under Section 564(b)(1) of the Act, 21 U.S.C. section 360bbb-3(b)(1), unless the authorization is terminated or revoked sooner.    Influenza A by PCR NEGATIVE NEGATIVE Final   Influenza B by PCR NEGATIVE NEGATIVE Final    Comment: (NOTE) The Xpert Xpress SARS-CoV-2/FLU/RSV assay is intended as an aid in  the diagnosis of influenza from Nasopharyngeal  swab specimens and  should not be used as a sole basis for treatment. Nasal washings and  aspirates are unacceptable for Xpert Xpress SARS-CoV-2/FLU/RSV  testing. Fact Sheet for Patients: PinkCheek.be Fact Sheet for Healthcare Providers: GravelBags.it This test is not yet approved or cleared by the [place] FDA and  has been authorized for detection and/or diagnosis of SARS-CoV-2 by  FDA under an Emergency Use Authorization (EUA). This EUA will remain  in effect (meaning this test can be used) for the duration of the  Covid-19 declaration under Section 564(b)(1) of the Act, 21  U.S.C. section 360bbb-3(b)(1), unless the authorization is  terminated or revoked. Performed at [facility], [address] [place], [place] [address]          Radiology Studies: [facility] Chest [place] 1 View  Result Date: 07/12/2019 CLINICAL DATA:  Vomiting.  Blood in stool. EXAM:  PORTABLE CHEST 1 VIEW COMPARISON:  07/04/2008 FINDINGS: Generous lung volumes diaphragm flattening. There is no edema, consolidation, effusion, or pneumothorax. Minimal scarring or atelectasis over the right infrahilar lung. Normal heart size and mediastinal contours. No acute osseous finding. IMPRESSION: 1. No acute finding. 2. Large lung volumes, question risk factors for COPD. Electronically Signed   By: Monte Fantasia M.D.   On: 07/12/2019 07:03        Scheduled Meds: . LORazepam  0-4 mg Intravenous Q6H  . nicotine  21 mg Transdermal Daily  . thiamine  100 mg Intravenous Daily   Continuous Infusions: . sodium chloride 75 mL/hr at 07/13/19 0926  . pantoprozole (PROTONIX) infusion 8 mg/hr (07/13/19 0926)     LOS: 1 day    Time spent: 25 minutes    Sharen Hones, MD [facility]   To contact the attending provider between 7A-7P or the covering provider during after hours 7P-7A, please log into the web site [email or web address] and access using universal [place] password for that web site. If you do not have the password, please call the hospital operator.  07/13/2019, 9:52 AM

## 2019-07-14 ENCOUNTER — Encounter: Payer: Self-pay | Admitting: *Deleted

## 2019-07-14 LAB — SURGICAL PATHOLOGY

## 2019-07-16 ENCOUNTER — Telehealth: Payer: Self-pay | Admitting: Gastroenterology

## 2019-07-16 NOTE — Telephone Encounter (Signed)
I called pt to make an appointment Melynda Ripple, Margaretmary Dys, CMA  [facility] [place] [facility]  Patient needs 1 month hospital follow up appointment with Dr. Allegra Lai  L/m for pt on 07-16-2019 @ 1:39

## 2019-07-21 ENCOUNTER — Other Ambulatory Visit: Payer: Self-pay

## 2019-08-31 ENCOUNTER — Ambulatory Visit: Payer: BLUE CROSS/BLUE SHIELD | Admitting: Gastroenterology

## 2020-04-03 ENCOUNTER — Ambulatory Visit: Payer: BLUE CROSS/BLUE SHIELD | Admitting: Gastroenterology

## 2021-07-04 ENCOUNTER — Emergency Department: Payer: BLUE CROSS/BLUE SHIELD

## 2021-07-04 ENCOUNTER — Other Ambulatory Visit: Payer: Self-pay

## 2021-07-04 ENCOUNTER — Observation Stay
Admission: EM | Admit: 2021-07-04 | Discharge: 2021-07-05 | Disposition: A | Discharge status: Home / Self Care | Payer: BLUE CROSS/BLUE SHIELD | Attending: Internal Medicine | Admitting: Internal Medicine

## 2021-07-04 DIAGNOSIS — F17200 Nicotine dependence, unspecified, uncomplicated: Secondary | ICD-10-CM | POA: Diagnosis not present

## 2021-07-04 DIAGNOSIS — Z789 Other specified health status: Secondary | ICD-10-CM

## 2021-07-04 DIAGNOSIS — R202 Paresthesia of skin: Secondary | ICD-10-CM

## 2021-07-04 DIAGNOSIS — F1010 Alcohol abuse, uncomplicated: Secondary | ICD-10-CM | POA: Insufficient documentation

## 2021-07-04 DIAGNOSIS — E119 Type 2 diabetes mellitus without complications: Secondary | ICD-10-CM | POA: Diagnosis not present

## 2021-07-04 DIAGNOSIS — I6381 Other cerebral infarction due to occlusion or stenosis of small artery: Secondary | ICD-10-CM

## 2021-07-04 DIAGNOSIS — Z720 Tobacco use: Secondary | ICD-10-CM

## 2021-07-04 DIAGNOSIS — I700 Atherosclerosis of aorta: Secondary | ICD-10-CM

## 2021-07-04 DIAGNOSIS — R200 Anesthesia of skin: Secondary | ICD-10-CM

## 2021-07-04 DIAGNOSIS — I639 Cerebral infarction, unspecified: Secondary | ICD-10-CM | POA: Diagnosis not present

## 2021-07-04 DIAGNOSIS — I10 Essential (primary) hypertension: Secondary | ICD-10-CM | POA: Diagnosis not present

## 2021-07-04 DIAGNOSIS — Z79899 Other long term (current) drug therapy: Secondary | ICD-10-CM | POA: Diagnosis not present

## 2021-07-04 LAB — CBC
HCT: 46.2 % — ABNORMAL HIGH (ref 36.0–46.0)
Hemoglobin: 16.2 g/dL — ABNORMAL HIGH (ref 12.0–15.0)
MCH: 33.7 pg (ref 26.0–34.0)
MCHC: 35.1 g/dL (ref 30.0–36.0)
MCV: 96 fL (ref 80.0–100.0)
Platelets: 282 10*3/uL (ref 150–400)
RBC: 4.81 MIL/uL (ref 3.87–5.11)
RDW: 13.6 % (ref 11.5–15.5)
WBC: 6.5 10*3/uL (ref 4.0–10.5)
nRBC: 0 % (ref 0.0–0.2)

## 2021-07-04 LAB — DIFFERENTIAL
Abs Immature Granulocytes: 0.01 10*3/uL (ref 0.00–0.07)
Basophils Absolute: 0 10*3/uL (ref 0.0–0.1)
Basophils Relative: 1 %
Eosinophils Absolute: 0.1 10*3/uL (ref 0.0–0.5)
Eosinophils Relative: 2 %
Immature Granulocytes: 0 %
Lymphocytes Relative: 32 %
Lymphs Abs: 2.1 10*3/uL (ref 0.7–4.0)
Monocytes Absolute: 0.5 10*3/uL (ref 0.1–1.0)
Monocytes Relative: 8 %
Neutro Abs: 3.8 10*3/uL (ref 1.7–7.7)
Neutrophils Relative %: 57 %

## 2021-07-04 LAB — COMPREHENSIVE METABOLIC PANEL
ALT: 18 U/L (ref 0–44)
AST: 38 U/L (ref 15–41)
Albumin: 4.4 g/dL (ref 3.5–5.0)
Alkaline Phosphatase: 117 U/L (ref 38–126)
Anion gap: 10 (ref 5–15)
BUN: <5 mg/dL — ABNORMAL LOW (ref 6–20)
CO2: 23 mmol/L (ref 22–32)
Calcium: 9.1 mg/dL (ref 8.9–10.3)
Chloride: 101 mmol/L (ref 98–111)
Creatinine, Ser: 0.51 mg/dL (ref 0.44–1.00)
GFR, Estimated: >60 mL/min (ref >60)
Glucose, Bld: 141 mg/dL — ABNORMAL HIGH (ref 70–99)
Potassium: 3.5 mmol/L (ref 3.5–5.1)
Sodium: 134 mmol/L — ABNORMAL LOW (ref 135–145)
Total Bilirubin: 0.5 mg/dL (ref 0.3–1.2)
Total Protein: 8.6 g/dL — ABNORMAL HIGH (ref 6.5–8.1)

## 2021-07-04 LAB — TROPONIN I (HIGH SENSITIVITY): Troponin I (High Sensitivity): 7 ng/L (ref <18)

## 2021-07-04 LAB — PROTIME-INR
INR: 0.9 (ref 0.8–1.2)
Prothrombin Time: 12.5 seconds (ref 11.4–15.2)

## 2021-07-04 LAB — APTT: aPTT: 30 seconds (ref 24–36)

## 2021-07-04 LAB — MAGNESIUM: Magnesium: 2 mg/dL (ref 1.7–2.4)

## 2021-07-04 IMAGING — CT CT ANGIO HEAD-NECK (W OR W/O PERF)
2 of 11 series · 7 of 35 positions shown · IV contrast (APPLIED)
Comparison: None.

CLINICAL DATA: Neuro deficit, acute, stroke suspected. Left-sided
numbness and tingling.

EXAM:
CT ANGIOGRAPHY HEAD AND NECK
TECHNIQUE: Multidetector CT imaging of the head and neck was performed using
the standard protocol during bolus administration of intravenous
contrast. Multiplanar CT image reconstructions and MIPs were
obtained to evaluate the vascular anatomy. Carotid stenosis
measurements (when applicable) are obtained utilizing NASCET
criteria, using the distal internal carotid diameter as the
denominator.

[Series 8: cta head neck · axial · 0.49mm/px · z∈[-227,-109]mm · 2 of 179 slices shown]
[im 60/179  soft-tissue]
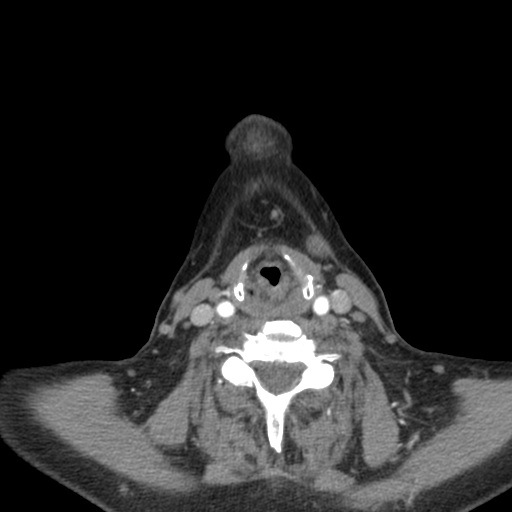
[im 119/179  soft-tissue]
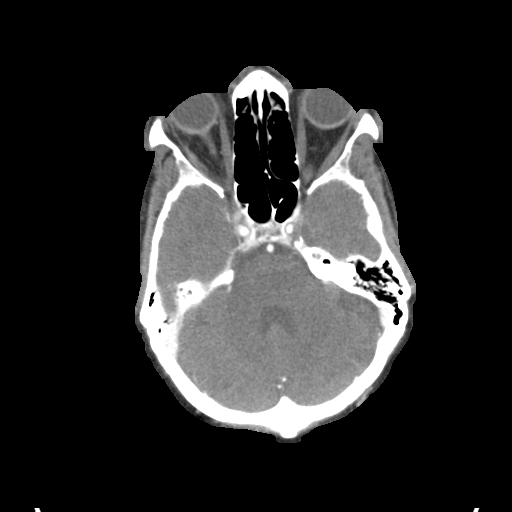

[Series 10: ax thin · axial · 0.58mm/px · z∈[-291,-59]mm · 5 of 349 slices shown]
[im 59/349  soft-tissue]
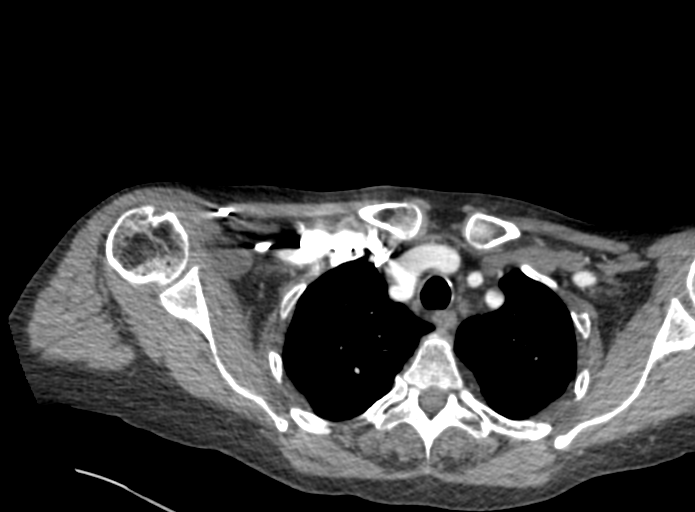
[im 117/349  bone]
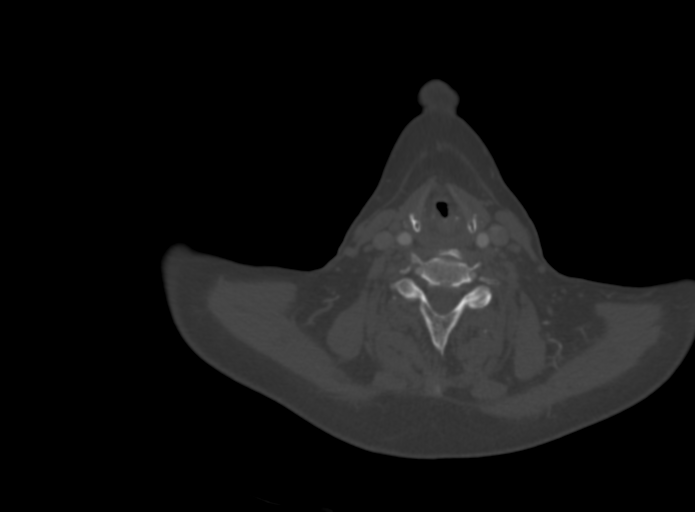
[im 175/349  soft-tissue]
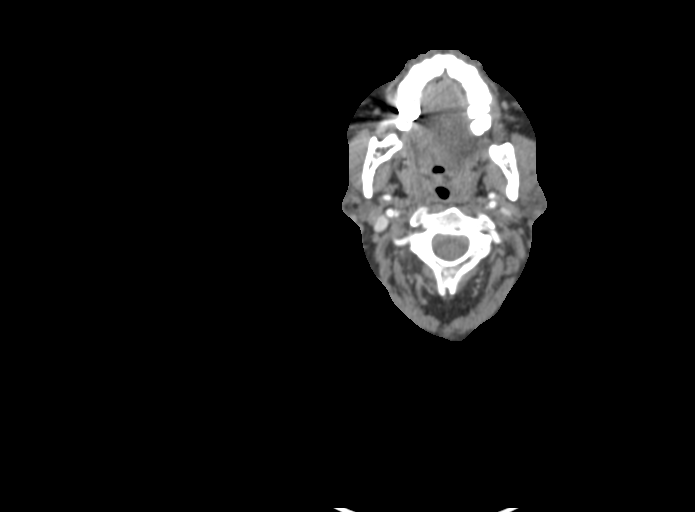
[im 233/349  bone]
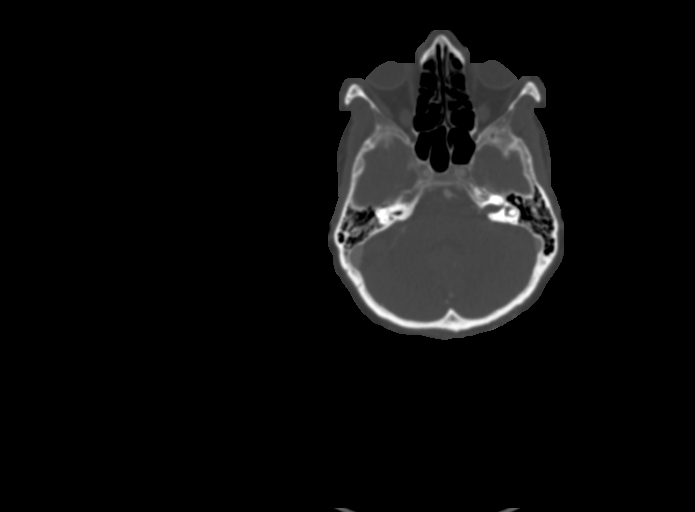
[im 291/349  soft-tissue]
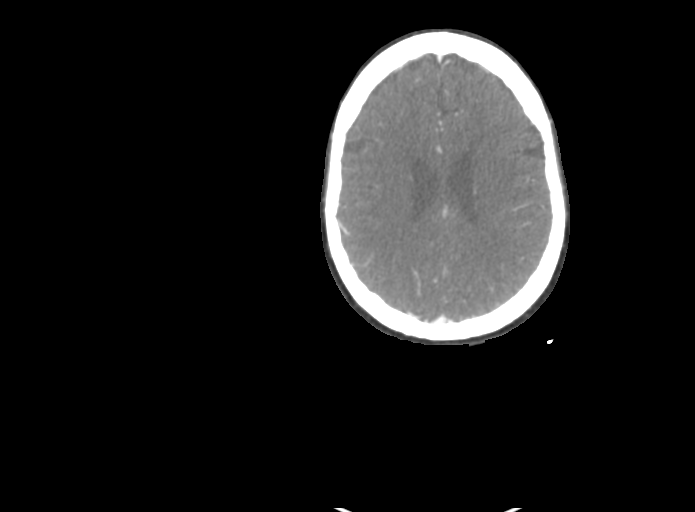

[7 of 35 positions shown; findings below may reference images not displayed]

RADIATION DOSE REDUCTION: This exam was performed according to the
departmental dose-optimization program which includes automated
exposure control, adjustment of the mA and/or kV according to
patient size and/or use of iterative reconstruction technique.

CONTRAST:  75mL OMNIPAQUE IOHEXOL 350 MG/ML SOLN
FINDINGS: CT HEAD FINDINGS

Brain: There is no evidence of an acute infarct, intracranial
hemorrhage, mass, midline shift, or extra-axial fluid collection.
The ventricles and sulci are within normal limits. Hypodensities in
the cerebral white matter bilaterally are nonspecific but compatible
with mild chronic small vessel ischemic disease.

Vascular: Calcified atherosclerosis at the skull base.

Skull: No fracture or suspicious osseous lesion.

Sinuses: Mucosal thickening inferiorly in the right maxillary sinus.
Clear mastoid air cells.

Orbits: Right cataract extraction.

Review of the MIP images confirms the above findings

CTA NECK FINDINGS

Aortic arch: Standard 3 vessel aortic arch with mild atherosclerotic
plaque. Widely patent arch vessel origins.

Right carotid system: Patent with a small amount of calcified and
soft plaque in the proximal ICA. No evidence of a significant
stenosis or dissection.

Left carotid system: Patent without evidence of stenosis,
dissection, or significant atherosclerosis.

Vertebral arteries: Patent without evidence of stenosis, dissection,
or significant atherosclerosis. Slightly dominant right vertebral
artery.

Skeleton: Moderately advanced cervical and upper thoracic facet
arthrosis.

Other neck: No evidence of cervical lymphadenopathy. Subcentimeter
thyroid nodules for which no follow-up imaging is recommended.

Upper chest: No apical lung consolidation or mass.

Review of the MIP images confirms the above findings

CTA HEAD FINDINGS

Anterior circulation: The internal carotid arteries are patent from
skull base to carotid termini with mild atherosclerotic plaque
bilaterally not resulting in significant stenosis. ACAs and MCAs are
patent without evidence of a proximal branch occlusion or
significant proximal stenosis. No aneurysm is identified.

Posterior circulation: The intracranial vertebral arteries are
widely patent to the basilar. Patent AICA and SCA origins are seen
bilaterally. The basilar artery is widely patent. Posterior
communicating arteries are diminutive or absent. Both PCAs are
patent without evidence of a significant proximal stenosis. No
aneurysm is identified.

Venous sinuses: Patent.

Anatomic variants: None.

Review of the MIP images confirms the above findings
IMPRESSION: 1. Mild atherosclerosis in the head and neck without large vessel
occlusion or significant proximal stenosis.
2. No evidence of acute intracranial abnormality.
3. Mild chronic small vessel ischemic disease in the cerebral white
matter.
4. Aortic Atherosclerosis ([2S]-[2S]).

## 2021-07-04 IMAGING — MR MR HEAD WO/W CM
13 series · 48 of 48 positions shown · IV contrast (gadavist)
Comparison: No prior MRI, correlation is made with CTA head and
neck [DATE]

CLINICAL DATA: Stroke suspected, numbness and tingling to left side
since [REDACTED]

EXAM:
MRI HEAD WITHOUT AND WITH CONTRAST
TECHNIQUE: Multiplanar, multiecho pulse sequences of the brain and surrounding
structures were obtained without and with intravenous contrast.
CONTRAST:  7mL GADAVIST GADOBUTROL 1 MMOL/ML IV SOLN

[Series 5: ax dwi_tracew · axial · 3.0mm · 0.71mm/px · z∈[-80,+81]mm · 3 of 56 slices shown]
[im 1/56]
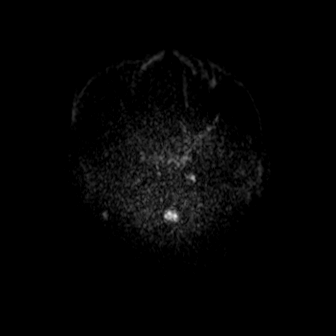
[im 28/56]
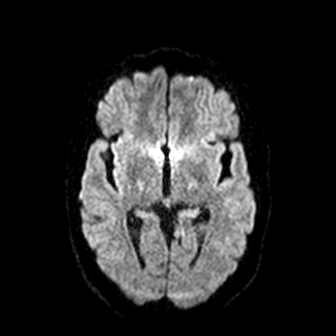
[im 56/56]
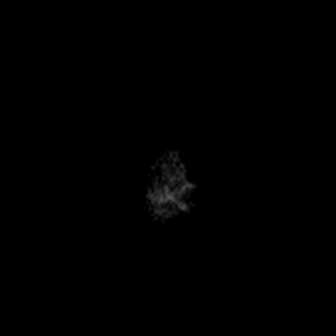

[Series 6: ax dwi_adc · axial · 3.0mm · 0.71mm/px · z∈[-80,+81]mm · 3 of 54 slices shown]
[im 1/54]
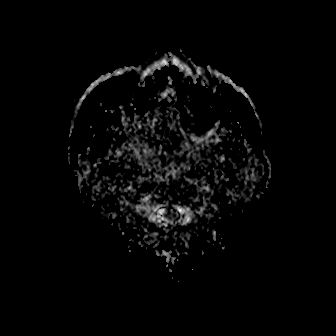
[im 27/54]
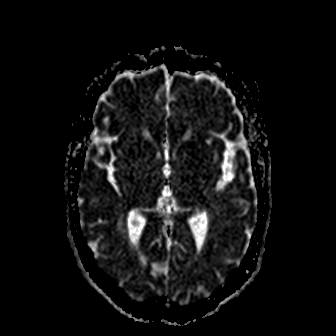
[im 54/54]
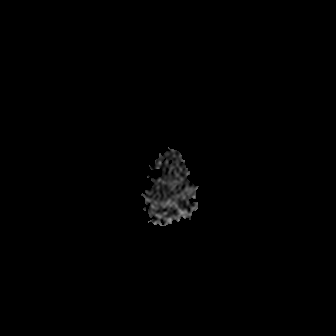

[Series 7: cor dwi_tracew · coronal · 5.0mm · 0.68mm/px · 2 of 40 slices shown]
[im 1/40]
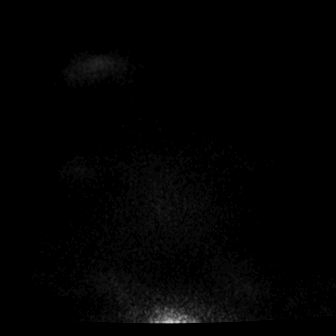
[im 40/40]
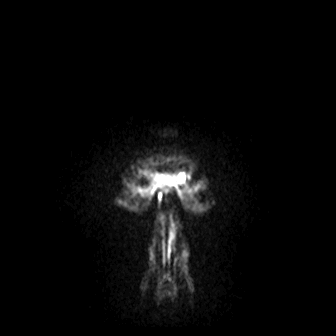

[Series 8: cor dwi_adc · coronal · 5.0mm · 0.68mm/px · 2 of 40 slices shown]
[im 1/40]
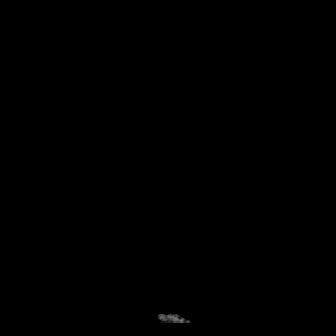
[im 40/40]
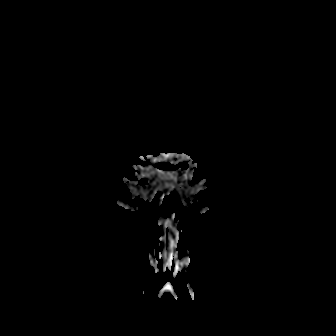

[Series 9: T1 · sagittal · 5.0mm · 0.62mm/px · 1 of 21 slices shown (1 of 2)]
[im 1/21]
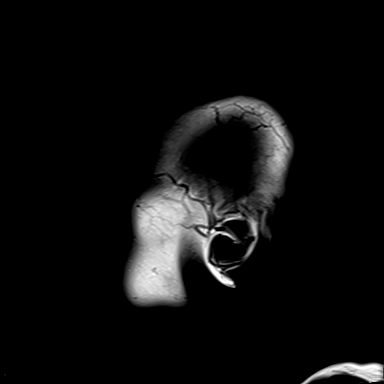

[Series 10: T2 · axial · 5.0mm · 0.53mm/px · 1 of 25 slices shown]
[im 1/25]
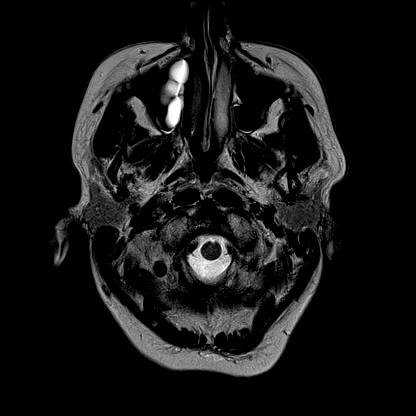

[Series 12: ax swi_pha · axial · 2.0mm · 0.90mm/px · z∈[-72,+82]mm · 5 of 80 slices shown]
[im 1/80]
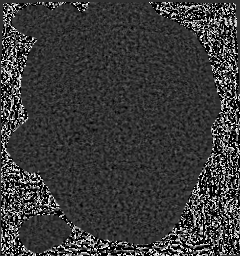
[im 20/80]
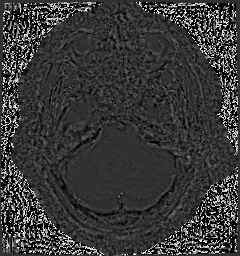
[im 40/80]
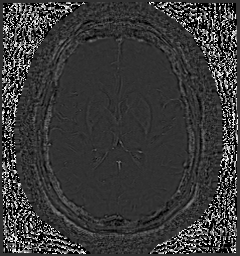
[im 60/80]
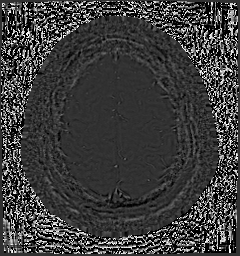
[im 80/80]
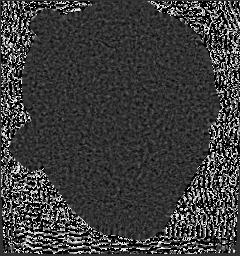

[Series 13: ax swi_swi · axial · 2.0mm · 0.90mm/px · z∈[-72,+82]mm · 4 of 77 slices shown]
[im 1/77]
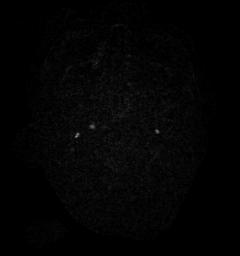
[im 26/77]
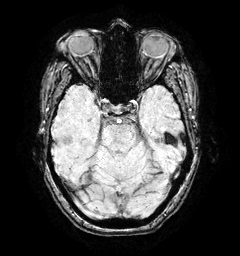
[im 51/77]
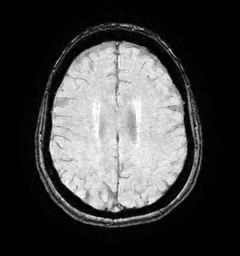
[im 77/77]
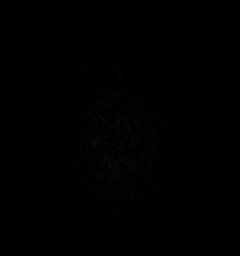

[Series 15: FLAIR · axial · 3.0mm · 0.53mm/px · z∈[-75,+83]mm · 3 of 52 slices shown]
[im 1/52]
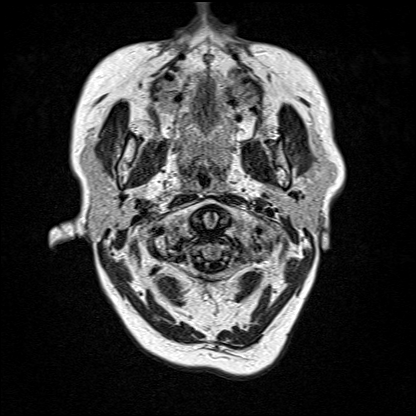
[im 26/52]
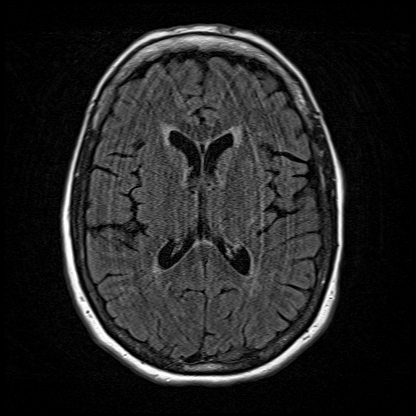
[im 52/52]
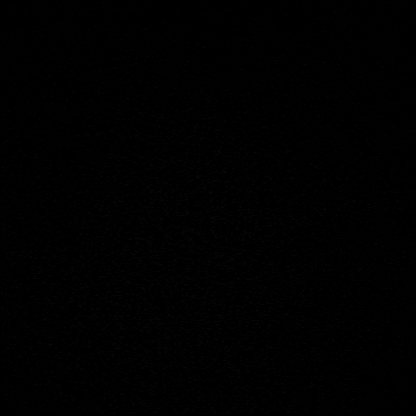

[Series 16: T1 · axial · 1.0mm · 0.98mm/px · z∈[-77,+93]mm · 10 of 170 slices shown (2 of 2)]
[im 1/170]
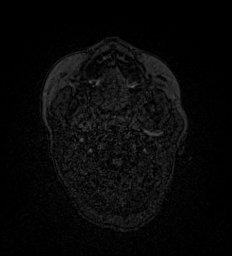
[im 19/170]
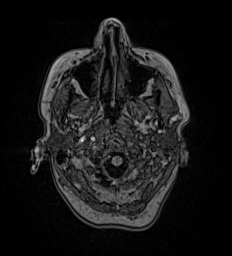
[im 38/170]
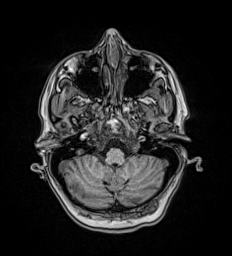
[im 57/170]
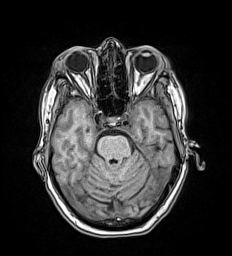
[im 76/170]
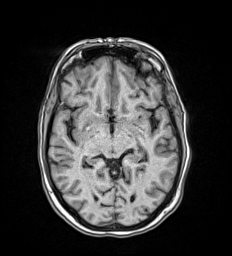
[im 94/170]
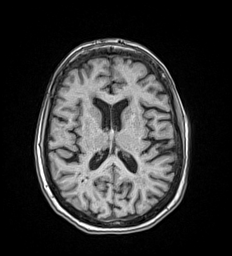
[im 113/170]
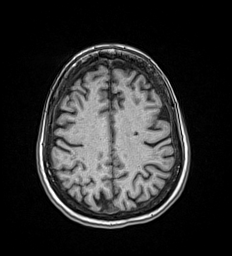
[im 132/170]
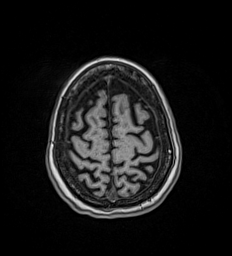
[im 151/170]
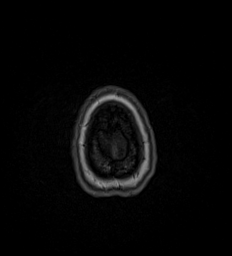
[im 170/170]
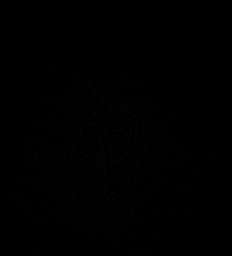

[Series 17: T2 post-contrast · coronal · 5.0mm · 0.57mm/px · 2 of 29 slices shown]
[im 1/29]
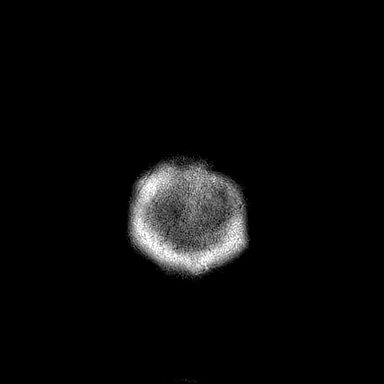
[im 29/29]
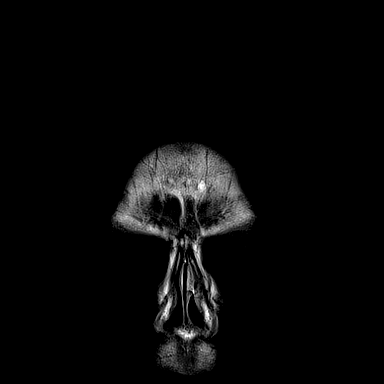

[Series 18: T1 post-contrast · axial · 1.0mm · 0.98mm/px · z∈[-77,+93]mm · 10 of 173 slices shown (1 of 2)]
[im 1/173]
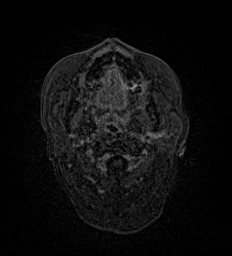
[im 20/173]
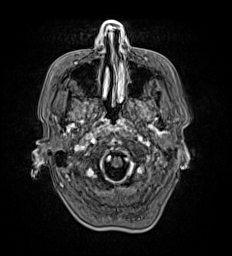
[im 39/173]
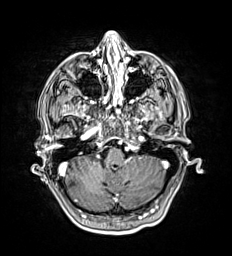
[im 58/173]
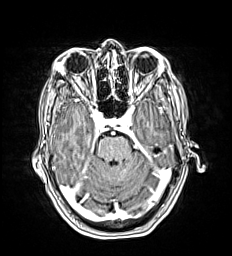
[im 77/173]
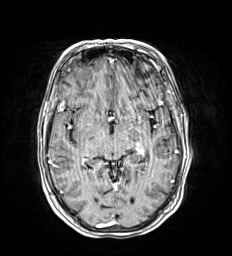
[im 96/173]
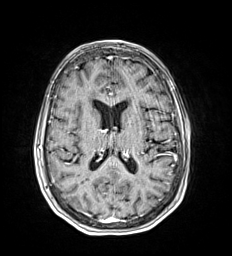
[im 115/173]
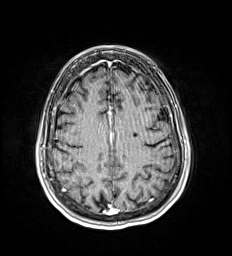
[im 134/173]
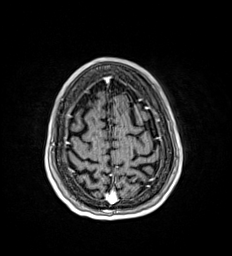
[im 153/173]
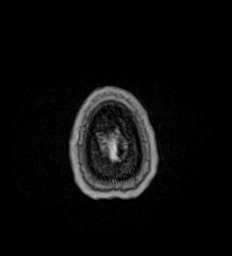
[im 173/173]
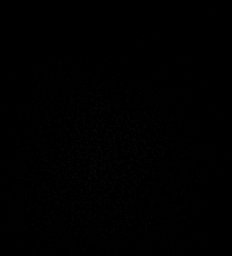

[Series 19: T1 post-contrast · coronal · 5.0mm · 0.57mm/px · 2 of 29 slices shown (2 of 2)]
[im 1/29]
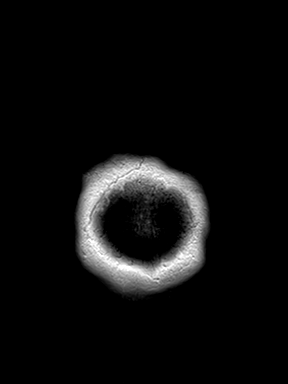
[im 29/29]
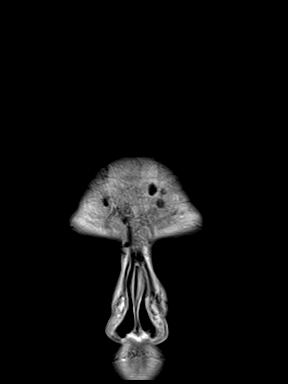

[48 of 48 positions shown; findings below may reference images not displayed]

FINDINGS: Evaluation is somewhat limited by motion artifact.

Brain: Small area of restricted diffusion in the right thalamus
(series 5, image 30) with ADC correlate (series 6, image 30), with
mildly increased T2 hyperintense signal, likely an acute to subacute
infarct. No acute hemorrhage, mass, mass effect, or midline shift.
No hydrocephalus or extra-axial collection. No abnormal parenchymal
or meningeal enhancement.

Mild T2 hyperintense signal in the periventricular white matter,
likely the sequela of chronic small vessel ischemic disease. Remote
lacunar infarct in the left corona radiata.

Vascular: Normal flow voids.

Skull and upper cervical spine: Normal marrow signal.

Sinuses/Orbits: Mucosal thickening in the right maxillary sinus. The
sinuses are otherwise clear. Status post right lens replacement.

Other: The mastoids are well aerated.
IMPRESSION: Small area of restricted diffusion in the right thalamus, consistent
with acute to subacute infarct.

## 2021-07-04 IMAGING — MR MR CERVICAL SPINE WO/W CM
5 of 8 series · 29 of 48 positions shown · IV contrast (7ml Gadavist)
Comparison: No prior MRI of the cervical spine

CLINICAL DATA: Numbness and tingling to left side, concern for
demyelinating disease

EXAM:
MRI CERVICAL SPINE WITHOUT AND WITH CONTRAST
TECHNIQUE: Multiplanar and multiecho pulse sequences of the cervical spine, to
include the craniocervical junction and cervicothoracic junction,
were obtained without and with intravenous contrast.
CONTRAST:  7mL GADAVIST GADOBUTROL 1 MMOL/ML IV SOLN

[Series 5: T2 · sagittal · 3.0mm · 0.62mm/px · 4 of 15 slices shown (1 of 2)]
[im 1/15]
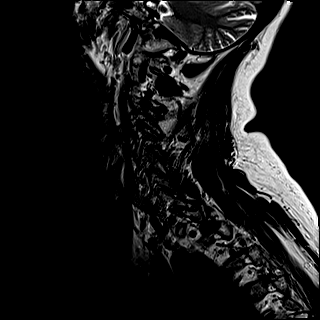
[im 5/15]
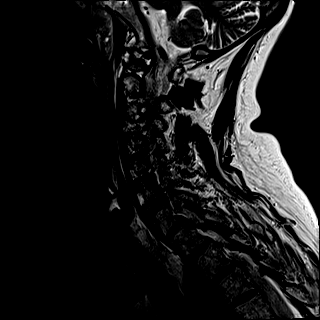
[im 10/15]
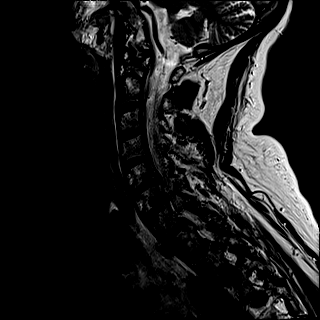
[im 15/15]
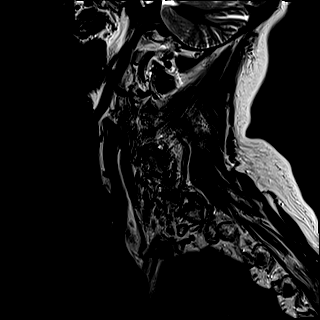

[Series 7: STIR · sagittal · 3.0mm · 0.62mm/px · 4 of 15 slices shown]
[im 1/15]
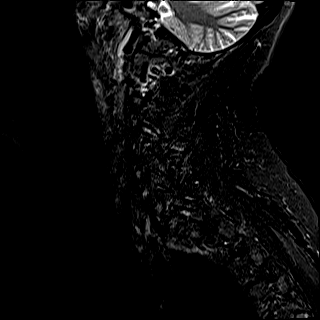
[im 5/15]
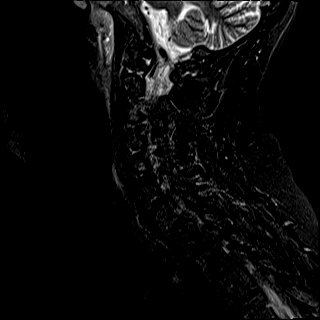
[im 10/15]
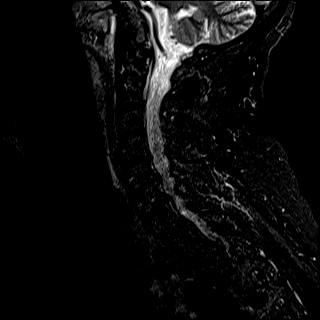
[im 15/15]
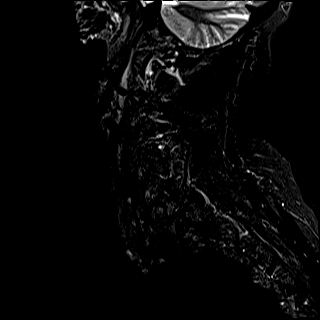

[Series 8: T2 · axial · 3.0mm · 0.70mm/px · z∈[-248,-156]mm · 8 of 29 slices shown (2 of 2)]
[im 1/29]
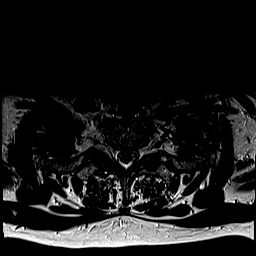
[im 5/29]
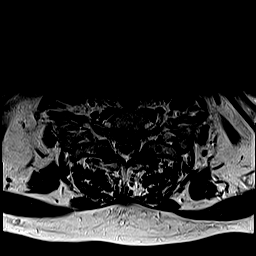
[im 9/29]
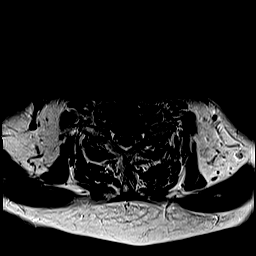
[im 13/29]
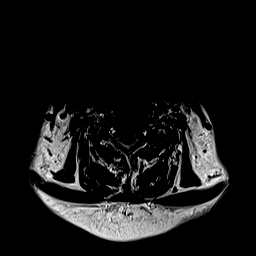
[im 17/29]
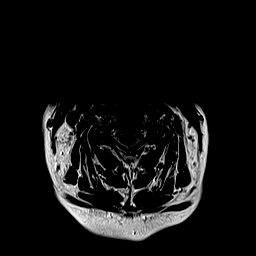
[im 21/29]
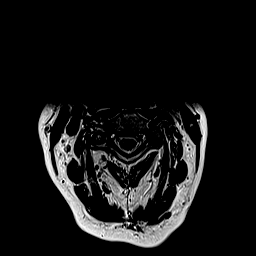
[im 25/29]
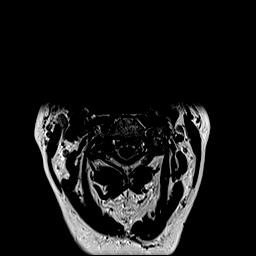
[im 29/29]
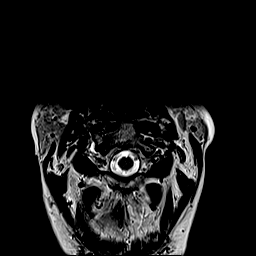

[Series 10: T1 · axial · non-contrast · 3.0mm · 0.35mm/px · z∈[-238,-146]mm · 8 of 29 slices shown]
[im 1/29]
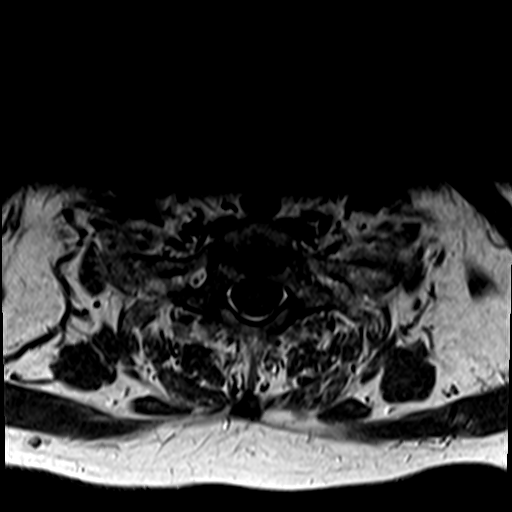
[im 5/29]
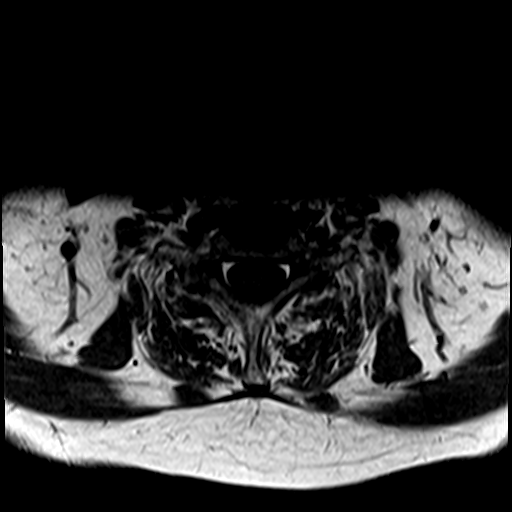
[im 9/29]
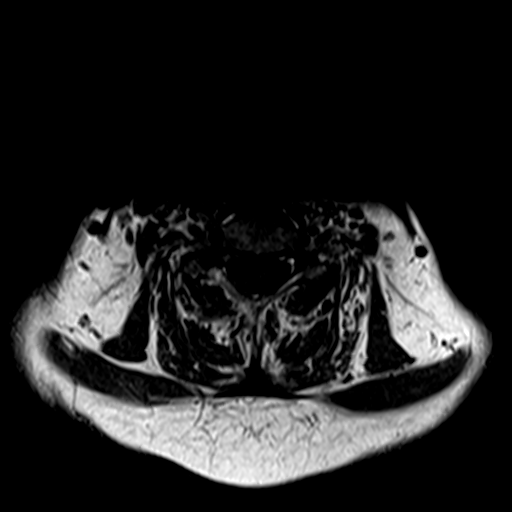
[im 13/29]
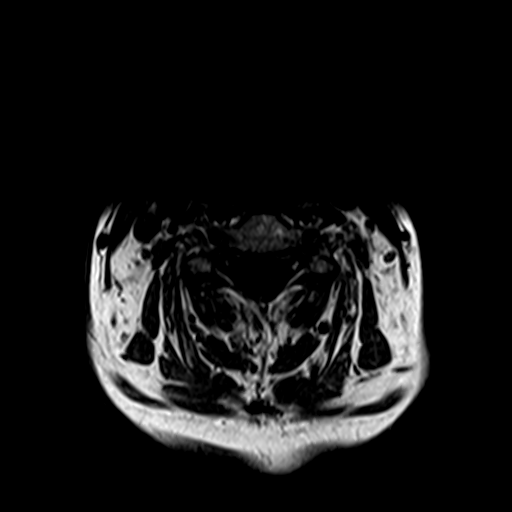
[im 17/29]
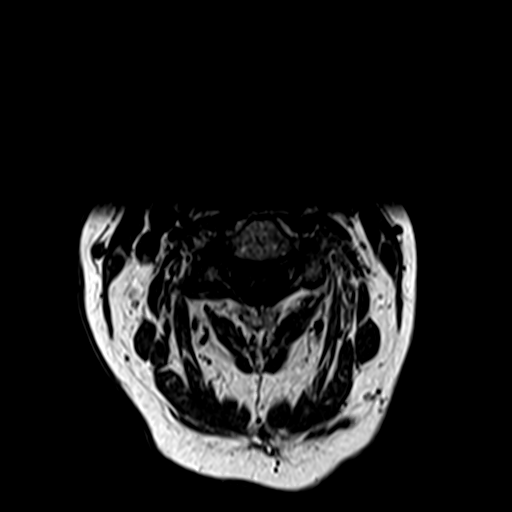
[im 21/29]
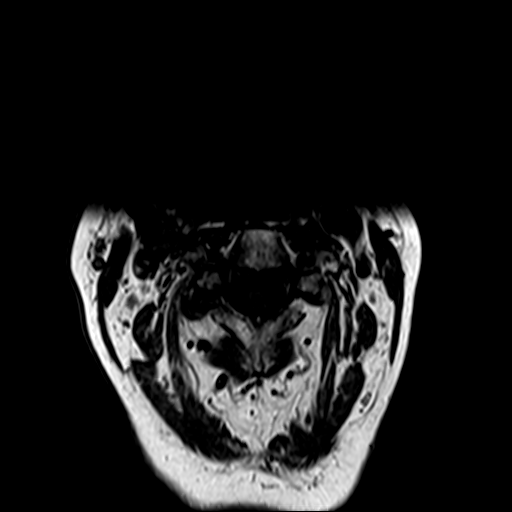
[im 25/29]
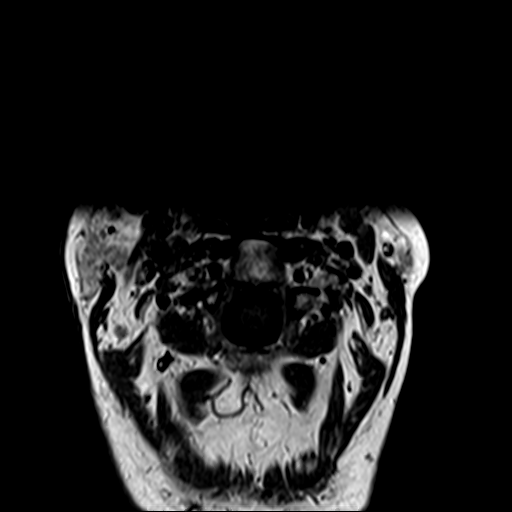
[im 29/29]
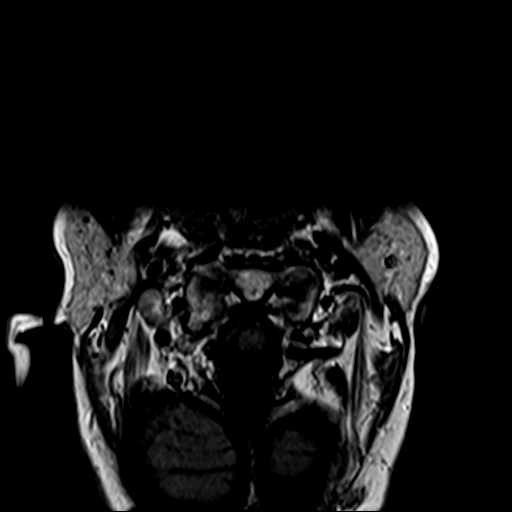

[Series 12: T1 post-contrast · axial · 3.0mm · 0.35mm/px · z∈[-238,-185]mm · 5 of 29 slices shown]
[im 1/29]
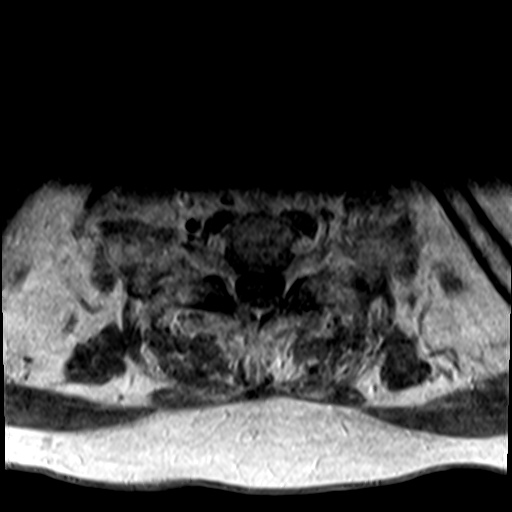
[im 5/29]
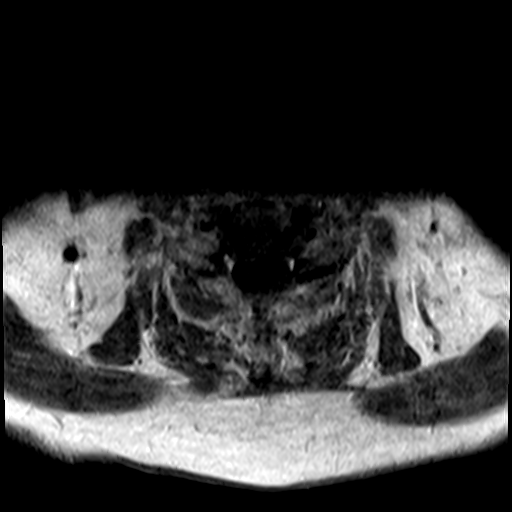
[im 9/29]
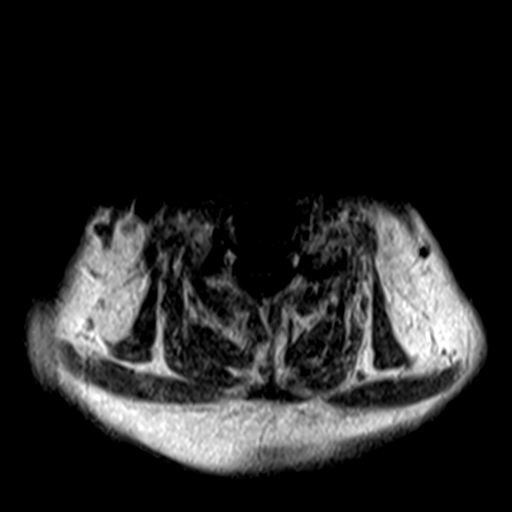
[im 13/29]
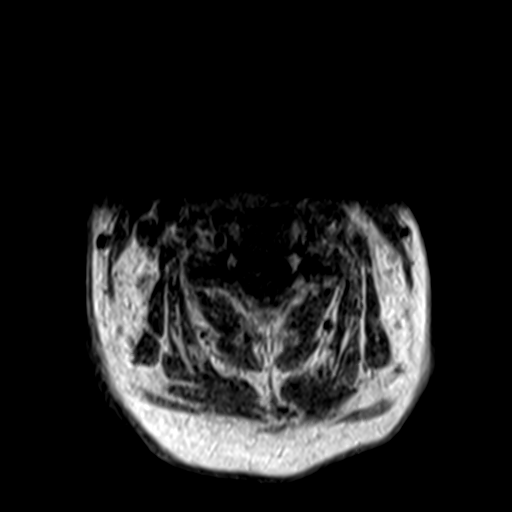
[im 17/29]
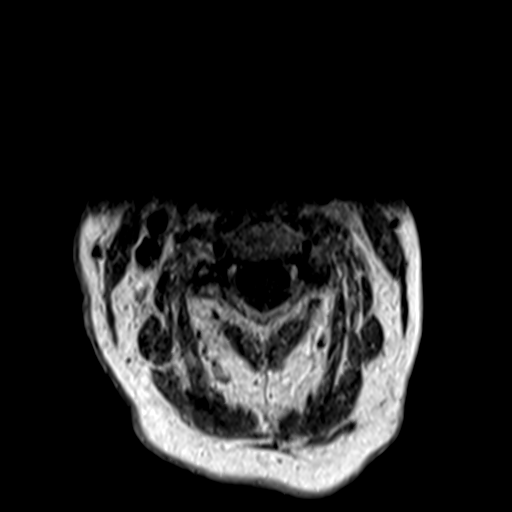

[29 of 48 positions shown; findings below may reference images not displayed]

FINDINGS: Alignment: Physiologic.

Vertebrae: No acute fracture or suspicious osseous lesion. No
abnormal osseous enhancement.

Cord: Normal signal and morphology. No foci of T2 hyperintense
signal or abnormal enhancement.

Posterior Fossa, vertebral arteries, paraspinal tissues: Negative.

Disc levels:

C2-C3: No significant disc bulge. Right-greater-than-left facet
arthropathy. No spinal canal stenosis. Mild right neural foraminal
narrowing.

C3-C4: No significant disc bulge. No spinal canal stenosis or neural
foraminal narrowing.

C4-C5: Minimal disc bulge. Facet and uncovertebral hypertrophy. No
spinal canal stenosis. Mild bilateral neural foraminal narrowing.

C5-C6: Disc bulge with superimposed left subarticular disc
protrusion, which abuts the ventral cord. Uncovertebral and facet
arthropathy. Mild spinal canal stenosis. Mild right and moderate
left neural foraminal narrowing.

C6-C7: Mild disc bulge. Facet and uncovertebral hypertrophy. No
spinal canal stenosis. Moderate bilateral neural foraminal
narrowing.

C7-T1: No significant disc bulge. No spinal canal stenosis or neural
foraminal narrowing.
IMPRESSION: 1. C5-C6 mild spinal canal stenosis with moderate left and mild
right neural foraminal narrowing.
2. C6-C7 moderate bilateral neural foraminal narrowing.
3. Mild neural foraminal narrowing on the right at C2-C3 and
bilaterally at C4-C5.
4. No spinal cord signal abnormality.  No abnormal enhancement.

## 2021-07-04 MED ORDER — ACETAMINOPHEN 650 MG RE SUPP: 650.0000 mg | RECTAL | Status: DC | PRN

## 2021-07-04 MED ORDER — LACTATED RINGERS IV BOLUS
1000.0000 mL | Freq: Once | INTRAVENOUS | Status: AC
  Administered 2021-07-04: 1000 mL via INTRAVENOUS

## 2021-07-04 MED ORDER — FOLIC ACID 1 MG PO TABS
1.0000 mg | ORAL_TABLET | Freq: Every day | ORAL | Status: DC
  Administered 2021-07-04 – 2021-07-05 (×2): 1 mg via ORAL
  Filled 2021-07-04 (×2): qty 1

## 2021-07-04 MED ORDER — ADULT MULTIVITAMIN W/MINERALS CH
1.0000 | ORAL_TABLET | Freq: Every day | ORAL | Status: DC
  Administered 2021-07-05: 1 via ORAL
  Filled 2021-07-04: qty 1

## 2021-07-04 MED ORDER — ACETAMINOPHEN 325 MG PO TABS: 650.0000 mg | ORAL_TABLET | ORAL | Status: DC | PRN

## 2021-07-04 MED ORDER — ENOXAPARIN SODIUM 40 MG/0.4ML IJ SOSY: 40.0000 mg | PREFILLED_SYRINGE | INTRAMUSCULAR | Status: DC

## 2021-07-04 MED ORDER — NICOTINE 21 MG/24HR TD PT24: 21.0000 mg | MEDICATED_PATCH | Freq: Every day | TRANSDERMAL | Status: DC | PRN

## 2021-07-04 MED ORDER — LORAZEPAM 1 MG PO TABS: 1.0000 mg | ORAL_TABLET | ORAL | Status: DC | PRN

## 2021-07-04 MED ORDER — ASPIRIN 81 MG PO CHEW
324.0000 mg | CHEWABLE_TABLET | Freq: Once | ORAL | Status: AC
  Administered 2021-07-04: 324 mg via ORAL
  Filled 2021-07-04: qty 4

## 2021-07-04 MED ORDER — GADOBUTROL 1 MMOL/ML IV SOLN
7.0000 mL | Freq: Once | INTRAVENOUS | Status: AC | PRN
  Administered 2021-07-04: 7 mL via INTRAVENOUS

## 2021-07-04 MED ORDER — LABETALOL HCL 5 MG/ML IV SOLN: 10.0000 mg | INTRAVENOUS | Status: DC | PRN

## 2021-07-04 MED ORDER — SUCRALFATE 1 G PO TABS
1.0000 g | ORAL_TABLET | Freq: Four times a day (QID) | ORAL | Status: DC
  Administered 2021-07-04 – 2021-07-05 (×3): 1 g via ORAL
  Filled 2021-07-04 (×3): qty 1

## 2021-07-04 MED ORDER — SENNOSIDES-DOCUSATE SODIUM 8.6-50 MG PO TABS: 1.0000 | ORAL_TABLET | Freq: Every evening | ORAL | Status: DC | PRN

## 2021-07-04 MED ORDER — ASPIRIN EC 81 MG PO TBEC
81.0000 mg | DELAYED_RELEASE_TABLET | Freq: Every day | ORAL | Status: DC
Start: 2021-07-05 — End: 2021-07-05
  Administered 2021-07-05: 81 mg via ORAL
  Filled 2021-07-04: qty 1

## 2021-07-04 MED ORDER — THIAMINE HCL 100 MG/ML IJ SOLN
100.0000 mg | Freq: Every day | INTRAMUSCULAR | Status: DC
  Administered 2021-07-04: 100 mg via INTRAVENOUS
  Filled 2021-07-04: qty 2

## 2021-07-04 MED ORDER — STROKE: EARLY STAGES OF RECOVERY BOOK: Freq: Once | Status: DC

## 2021-07-04 MED ORDER — IOHEXOL 350 MG/ML SOLN
75.0000 mL | Freq: Once | INTRAVENOUS | Status: AC | PRN
Start: 2021-07-04 — End: 2021-07-04
  Administered 2021-07-04: 75 mL via INTRAVENOUS

## 2021-07-04 MED ORDER — LABETALOL HCL 5 MG/ML IV SOLN
10.0000 mg | INTRAVENOUS | Status: DC | PRN
  Administered 2021-07-04: 10 mg via INTRAVENOUS
  Filled 2021-07-04: qty 4

## 2021-07-04 MED ORDER — PANTOPRAZOLE SODIUM 40 MG PO TBEC
40.0000 mg | DELAYED_RELEASE_TABLET | Freq: Two times a day (BID) | ORAL | Status: DC
  Administered 2021-07-04 – 2021-07-05 (×2): 40 mg via ORAL
  Filled 2021-07-04 (×2): qty 1

## 2021-07-04 MED ORDER — THIAMINE HCL 100 MG PO TABS
100.0000 mg | ORAL_TABLET | Freq: Every day | ORAL | Status: DC
  Administered 2021-07-05: 100 mg via ORAL
  Filled 2021-07-04: qty 1

## 2021-07-04 MED ORDER — ACETAMINOPHEN 160 MG/5ML PO SOLN
650.0000 mg | ORAL | Status: DC | PRN
  Filled 2021-07-04: qty 20.3

## 2021-07-04 MED ORDER — LORAZEPAM 2 MG/ML IJ SOLN
1.0000 mg | INTRAMUSCULAR | Status: DC | PRN
  Administered 2021-07-04: 2 mg via INTRAVENOUS
  Filled 2021-07-04: qty 1

## 2021-07-04 NOTE — TOC Initial Note (Signed)
Transition of Care (TOC) - Initial/Assessment Note  ? ? ?Patient Details  ?Name: Brandy Garner ?MRN: [id number] ?Date of Birth: 1965/06/08 ? ?Transition of Care (TOC) CM/SW Contact:    ?Shelbie Hutching, RN ?Phone Number: ?07/04/2021, 3:13 PM ? ?Clinical Narrative:                 ?Patient being seen in the emergency room for numbness that has been ongoing since last Thursday and has not gotten any better.  TOC received consult for substance abuse resources and met with patient in the emergency room at the bedside.  Patient lives alone in [place], works at [employer] at [place], she drives and is independent.  PCP is Dr. Lennox Grumbles, told patient that when she gets out of the hospital she needs to call and follow up with her PCP, she verbalizes understanding.  Patient smokes cigarettes about a pack per day.  Patient endorses drinking 4-6 beers per night.  Patient has considered cutting back and quitting.  Provided patient with resources in [place] and [facility] outpatient and inpatient resources for substance abuse treatment.  Informed patient also that if she would like to quit smoking to discuss with her PCP as they can provide medication to assist with smoking cessation.   ? ? ?Expected Discharge Plan: Home/Self Care ?Barriers to Discharge: Continued Medical Work up ? ? ?Patient Goals and CMS Choice ?Patient states their goals for this hospitalization and ongoing recovery are:: to find out what is causing the numbness ?  ?  ? ?Expected Discharge Plan and Services ?Expected Discharge Plan: Home/Self Care ?  ?Discharge Planning Services: CM Consult ?  ?Living arrangements for the past 2 months: Apartment ?                ?DME Arranged: N/A ?DME Agency: NA ?  ?  ?  ?HH Arranged: NA ?[place] Agency: NA ?  ?  ?  ? ?Prior Living Arrangements/Services ?Living arrangements for the past 2 months: Apartment ?Lives with:: Self ?Patient language and need for interpreter reviewed:: Yes ?Do you feel safe going back to the place where  you live?: Yes      ?Need for Family Participation in Patient Care: No (Comment) ?  ?  ?Criminal Activity/Legal Involvement Pertinent to Current Situation/Hospitalization: No - Comment as needed ? ?Activities of Daily Living ?  ?  ? ?Permission Sought/Granted ?  ?Permission granted to share information with : No ?   ?   ?   ?   ? ?Emotional Assessment ?Appearance:: Appears stated age ?Attitude/Demeanor/Rapport: Engaged ?Affect (typically observed): Accepting ?Orientation: : Oriented to Self, Oriented to Place, Oriented to  Time, Oriented to Situation ?Alcohol / Substance Use: Alcohol Use ?Psych Involvement: No (comment) ? ?Admission diagnosis:  numbness ?Patient Active Problem List  ? Diagnosis Date Noted  ? GIB (gastrointestinal bleeding) 07/12/2019  ? Alcohol use 07/12/2019  ? Tobacco abuse   ? ?PCP:  [facility] ?Pharmacy:   ?[id number] [place] [id number]?Phone: [phone]: [phone]? ? ? ? ?Social Determinants of Health (SDOH) Interventions ?  ? ?Readmission Risk Interventions ?   ? View : No data to display.  ?  ?  ?  ? ? ? ?

## 2021-07-04 NOTE — Hospital Course (Addendum)
Ms. Brandy Garner is a 56 year old female with history of alcohol dependence, chronic nicotine use, who presents emergency department for chief concerns of left leg and foot tingling and left arm and hand tingling with numbness.   ? ?Initial vitals in the emergency department showed temperature of 98.9, respiration rate 18, heart rate 122, blood pressure 197/110, SPO2 98% on room air. ? ?Serum sodium 134, potassium 2.5, chloride 101, bicarb of 23, BUN of less than 5, serum creatinine of 0.51, GFR greater than 60, nonfasting blood glucose 141, WBC of 6.5, hemoglobin 16.2, platelets of 282.  PT was 12.5, INR of 2.9. ? ?CTA of the head and neck with and without contrast material: Mild atherosclerosis of the head and neck without large vessel occlusion or significant proximal stenosis.  No evidence of acute intracranial abnormality.  Mild chronic small vessel ischemic disease and cerebral white matter.  Aortic atherosclerosis. ? ?MRI of the brain with and without contrast was read as small area of restricted diffusion in the right thalamus, consistent with acute to subacute infarct. ? ?ED treatment: Patient was given aspirin 324 mg one-time dose, and patient was started on CIWA protocol including folic acid and thiamine. ?

## 2021-07-04 NOTE — ED Notes (Signed)
See triage note.  ? ?Pt states this started this past Thursday. Pt states her feet feel tingling like they are asleep. NAD noted.  ?

## 2021-07-04 NOTE — ED Provider Notes (Signed)
----------------------------------------- ?  3:13 PM on 07/04/2021 ?----------------------------------------- ? ?Blood pressure (!) 177/84, pulse (!) 102, temperature 98.9 ?F (37.2 ?C), temperature source Oral, resp. rate 17, weight 70.8 kg, SpO2 99 %. ? ?Assuming care from Dr. Tamala Julian.  In short, Brandy Garner is a 56 y.o. female with a chief complaint of Numbness ?Marland Kitchen  Refer to the original H&P for additional details. ? ?The current plan of care is to follow-up neuro recs for left sided numbness/tingling. ? ?----------------------------------------- ?7:51 PM on 07/04/2021 ?----------------------------------------- ?Patient was evaluated by neurology, who recommends proceeding with MRI of brain and cervical spine.  Unfortunately, MRI of brain was significant for small thalamic stroke that could explain patient's symptoms.  We will give loading dose of aspirin and admit for further stroke work-up.  MRI of cervical spine shows mild to moderate narrowing but no significant findings to explain patient's symptoms. ? ?  ?Blake Divine, MD ?07/04/21 1952 ? ?

## 2021-07-04 NOTE — ED Notes (Signed)
Neuro at bedside.

## 2021-07-04 NOTE — ED Triage Notes (Addendum)
Pt comes pov with numbness and tingling to her left side since Thursday. No weakness. Went to chiropractor yesterday to see if it was back issues-no relief. Pt also does endorse some tingling in her right foot. Pt is an alcoholic-last drink last night.  ?

## 2021-07-04 NOTE — Consult Note (Signed)
Neurology Consultation ?Reason for Consult: Left sided tingling ?Requesting Physician: Hulan Saas ? ?CC: Tingling of the left leg and arm ? ?History is obtained from: Patient and chart review and ED provider ? ?HPI: Brandy Garner is a 56 y.o. female with a past medical history of heavy alcohol use, 1 pack/day smoking, diabetes. ? ?She reports that last Thursday she rubbed her back and noted that it felt tingly.  Her left leg and foot also felt tingly, and she has had some tingling in the left arm/hand.  This has been somewhat subtle and that she does not notice it all the time and currently in the ED is seems to be getting a little bit better but is still present.  When she went to her chiropractor a few days ago he was concerned and told her he she should go to the ED if the symptoms persisted. ? ?Otherwise she notes that she did have a mechanical fall 2 to 3 weeks earlier where she stumbled over the last stepped and bruised her right foot but otherwise did not hit her head or have any significant injury.  She has had a chronic smokers cough that may have been worse in the past 2 weeks.  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 ? ?LKW: 3/30 ?tPA given?: No, out of the window ?Premorbid modified rankin scale:  ?    0 - No symptoms. ? ?ROS: All other review of systems was negative except as noted in the HPI.  ? ?Past Medical History:  ?Diagnosis Date  ? Alcohol use   ? Diabetes mellitus without complication ([place])   ? Tobacco abuse   ? ?Past Surgical History:  ?Procedure Laterality Date  ? CESAREAN SECTION    ? ESOPHAGOGASTRODUODENOSCOPY N/A 07/13/2019  ? Procedure: ESOPHAGOGASTRODUODENOSCOPY (EGD);  Surgeon: Lin Landsman, MD;  Location: [facility] ENDOSCOPY;  Service: Gastroenterology;  Laterality: N/A;  ? ?Current Outpatient Medications  ?Medication Instructions  ? pantoprazole (PROTONIX) 40 mg, Oral, 2  times daily  ? sucralfate (CARAFATE) 1 g, Oral, 4 times daily  ? ?Family History  ?Problem Relation Age of Onset  ? Diabetes Mellitus II Father   ? Heart disease Father   ? ?Social History:  reports that she has been smoking. She has never used smokeless tobacco. She reports current alcohol use. She reports that she does not use drugs. ? ?Exam: ?Current vital signs: ?BP (!) 177/84   Pulse (!) 102   Temp 98.9 ?F (37.2 ?C) (Oral)   Resp 17   Wt 70.8 kg   SpO2 99%   BMI 25.18 kg/m?  ?Vital signs in last 24 hours: ?Temp:  [98.9 ?F (37.2 ?C)] 98.9 ?F (37.2 ?C) (04/05 1208) ?Pulse Rate:  [102-122] 102 (04/05 1400) ?Resp:  [17-18] 17 (04/05 1400) ?BP: (177-197)/(84-110) 177/84 (04/05 1400) ?SpO2:  [98 %-99 %] 99 % (04/05 1400) ?Weight:  [70.8 kg] 70.8 kg (04/05 1205) ? ? ?Physical Exam  ?Constitutional: Appears well-developed and well-nourished.  ?Psych: Affect appropriate to situation, calm and cooperative ?Eyes: No scleral injection ?HENT: No oropharyngeal obstruction.  ?MSK: no joint deformities.  ?Cardiovascular: Perfusing extremities well ?Respiratory: Effort normal, non-labored breathing ?GI: Soft.  No distension. There is no tenderness.  ?Skin: Warm dry and intact visible skin ? ?Neuro: ?Mental Status: ?Patient is awake, alert, oriented to person, place, month, year, and situation. ?Patient is able to give a clear and coherent  history. ?No signs of aphasia or neglect ?Cranial Nerves: ?II: Visual Fields are full. Pupils are equal, round, and reactive to light.   ?III,IV, VI: EOMI without ptosis or diploplia.  ?V: Facial sensation is symmetric to temperature ?VII: Facial movement is symmetric.  ?VIII: hearing is intact to voice ?X: Uvula elevates symmetrically ?XI: Shoulder shrug is symmetric. ?XII: tongue is midline without atrophy or fasciculations.  ?Motor: ?Tone is normal. Bulk is normal. 5/5 strength was present in all four extremities.  ?Sensory: ?She has loss of vibratory sensation in bilateral feet.   Temperature and light touch are intact in the bilateral arms and legs, but she does have some paresthesias predominantly in the left leg but slightly in the left hand as well ?Deep Tendon Reflexes: ?2+ and symmetric in the brachioradialis and patellae.  No clonus ?Plantars: ?Toes are downgoing bilaterally.  ?Cerebellar: ?FNF and HKS are intact bilaterally ?Gait:  ?Deferred ? ?NIHSS total 1 ?Score breakdown: Left arm and leg paresthesias ?Performed at 3:40 PM ? ? ?I have reviewed labs in epic and the results pertinent to this consultation are: ? ?Basic Metabolic Panel: ?Recent Labs  ?Lab 07/04/21 ?1209  ?NA 134*  ?K 3.5  ?CL 101  ?CO2 23  ?GLUCOSE 141*  ?BUN <5*  ?CREATININE 0.51  ?CALCIUM 9.1  ?MG 2.0  ? ? ?CBC: ?Recent Labs  ?Lab 07/04/21 ?1209  ?WBC 6.5  ?NEUTROABS 3.8  ?HGB 16.2*  ?HCT 46.2*  ?MCV 96.0  ?PLT 282  ? ? ?Coagulation Studies: ?Recent Labs  ?  07/04/21 ?1209  ?LABPROT 12.5  ?INR 0.9  ?  ?No results found for: HGBA1C ? ? ?I have reviewed the images obtained:  ? ?CTA: ?1. Mild atherosclerosis in the head and neck without large vessel ?occlusion or significant proximal stenosis. ?2. No evidence of acute intracranial abnormality. ?3. Mild chronic small vessel ischemic disease in the cerebral white ?matter. ?4. Aortic Atherosclerosis (ICD10-I70.0). ?  ? ?Impression:  ?Patient presents with isolated paresthesias in the left back arm and leg that appear to wax and wane somewhat in intensity.  Her neurological examination is otherwise entirely reassuring.  Nevertheless a pure sensory thalamic stroke could explain her symptoms.  Less likely a cervical spine process, but this would also be possible, for example subacute mentioned duration in the setting of B12 deficiency due to her ethanol use.  At the age of 6, multiple sclerosis is less likely, but will obtain contrasted imaging to exclude this possibility ? ?Recommendations: ?-MRI brain and cervical spine with and without contrast ?-If these studies are  negative for any acute process, outpatient follow-up is appropriate ?-Smoking and alcohol cessation counseling performed ?-B12, MMA (can be followed up by outpatient provider), patient counseled to empirically supplement pending results ? ?Lesleigh Noe MD-PhD ?[facility] ?[phone] ?[facility] coverage for [facility] is from 8 AM to 4 AM in-house and 4 PM to 8 PM by telephone/video. 8 PM to 8 AM emergent questions or overnight urgent questions should be addressed to Teleneurology On-call or [facility] neurohospitalist; contact information can be found on AMION ? ?

## 2021-07-04 NOTE — ED Provider Notes (Signed)
? ?[facility] ?Provider Note ? ? ? Event Date/Time  ? First MD Initiated Contact with Patient 07/04/21 1211   ?  (approximate) ? ? ?History  ? ?Numbness ? ? ?HPI ? ?Brandy Garner is a 56 y.o. female with a past medical history of DM, tobacco abuse and alcohol abuse drinking 4-6 beverages per day who presents for evaluation of approximately 7 days of some left hemibody "numbness" as well as some numbness in the right foot.  Patient states that 2 days ago she went to her chiropractor to see if it helped but it did not help at all.  She states she has a little tightness in the left side of her body including the arms and legs.  She also states her right foot has been felt tingling since this time.  She think she fell a couple weeks ago but is not sure if he hit anything.  No other subsequent or recent falls.  No headache, earache, sore throat, chest pain, cough, shortness of breath, abdominal pain, nausea, vomiting, diarrhea or any weakness or incontinence.  No prior similar episodes.  No clear alleviating aggravating factors.  Patient has not had any alcohol today.  She denies any illicit drug use. ? ?  ?Past Medical History:  ?Diagnosis Date  ? Alcohol use   ? Diabetes mellitus without complication ([place])   ? Tobacco abuse   ? ? ? ?Physical Exam  ?Triage Vital Signs: ?ED Triage Vitals  ?Enc Vitals Group  ?   BP 07/04/21 1208 (!) 197/110  ?   Pulse Rate 07/04/21 1208 (!) 122  ?   Resp 07/04/21 1208 18  ?   Temp 07/04/21 1208 98.9 ?F (37.2 ?C)  ?   Temp Source 07/04/21 1208 Oral  ?   SpO2 07/04/21 1208 98 %  ?   Weight 07/04/21 1205 156 lb (70.8 kg)  ?   Height --   ?   Head Circumference --   ?   Peak Flow --   ?   Pain Score 07/04/21 1205 0  ?   Pain Loc --   ?   Pain Edu? --   ?   Excl. in [place]? --   ? ? ?Most recent vital signs: ?Vitals:  ? 07/04/21 1208 07/04/21 1400  ?BP: (!) 197/110 (!) 177/84  ?Pulse: (!) 122 (!) 102  ?Resp: 18 17  ?Temp: 98.9 ?F (37.2 ?C)   ?SpO2: 98% 99%   ? ? ?General: Awake, no distress.  ?CV:  Good peripheral perfusion.  2+ radial pulse.  Tachycardic.  No significant murmur. ?Resp:  Normal effort.  Bilaterally. ?Abd:  No distention.  Soft throughout. ?Other:  Cranial nerves II through XII grossly intact.  No pronator drift.  No finger dysmetria.  Symmetric 5/5 strength of all extremities.  Sensation intact to light touch in all extremities.  Unremarkable unassisted gait. ? ? ? ?ED Results / Procedures / Treatments  ?Labs ?(all labs ordered are listed, but only abnormal results are displayed) ?Labs Reviewed  ?CBC - Abnormal; Notable for the following components:  ?    Result Value  ? Hemoglobin 16.2 (*)   ? HCT 46.2 (*)   ? All other components within normal limits  ?COMPREHENSIVE METABOLIC PANEL - Abnormal; Notable for the following components:  ? Sodium 134 (*)   ? Glucose, Bld 141 (*)   ? BUN <5 (*)   ? Total Protein 8.6 (*)   ? All other components within  normal limits  ?PROTIME-INR  ?APTT  ?DIFFERENTIAL  ?MAGNESIUM  ?VITAMIN B12  ?CBG MONITORING, ED  ?TROPONIN I (HIGH SENSITIVITY)  ? ? ? ?EKG ? ?EKG is remarkable for sinus tachycardia with a ventricular rate of 121, normal axis, unremarkable intervals with some nonspecific changes in inferior leads without other clear evidence of acute ischemia or significant arrhythmia. ? ?RADIOLOGY ? ?CTA head and neck reviewed by myself without evidence of ischemia, hemorrhage, mass effect, edema or evidence of large stenosis or dissection.  I reviewed radiology interpretation and agree with the findings of atherosclerosis and small vessel ischemic disease without other acute process. ? ?PROCEDURES: ? ?Critical Care performed: No ? ?Procedures ? ?The patient is on the cardiac monitor to evaluate for evidence of arrhythmia and/or significant heart rate changes. ? ? ?MEDICATIONS ORDERED IN ED: ?Medications  ?LORazepam (ATIVAN) tablet 1-4 mg ( Oral See Alternative 07/04/21 1427)  ?  Or  ?LORazepam (ATIVAN) injection 1-4 mg (2 mg  Intravenous Given 07/04/21 1427)  ?thiamine tablet 100 mg ( Oral See Alternative 07/04/21 1427)  ?  Or  ?thiamine (B-1) injection 100 mg (100 mg Intravenous Given 07/04/21 1427)  ?folic acid (FOLVITE) tablet 1 mg (1 mg Oral Given 07/04/21 1427)  ?multivitamin with minerals tablet 1 tablet (1 tablet Oral Patient Refused/Not Given 07/04/21 1415)  ?lactated ringers bolus 1,000 mL (1,000 mLs Intravenous Bolus 07/04/21 1315)  ?iohexol (OMNIPAQUE) 350 MG/ML injection 75 mL (75 mLs Intravenous Contrast Given 07/04/21 1346)  ? ? ? ?IMPRESSION / MDM / ASSESSMENT AND PLAN / ED COURSE  ?I reviewed the triage vital signs and the nursing notes. ?             ?               ? ?Differential diagnosis includes, but is not limited to CVA, cervical spine injury or radiculopathy, CVT, press, SAH, metabolic derangements, and hypertensive emergency. ? ?CMP shows no significant electrolyte or metabolic derangements.  CBC without leukocytosis or acute anemia.  Magnesium is within normal limits.  PTT and PT are within normal limits. ? ?ECG and nonelevated troponin not suggestive of ACS. ? ?CTA head and neck reviewed by myself without evidence of ischemia, hemorrhage, mass effect, edema or evidence of large stenosis or dissection.  I reviewed radiology interpretation and agree with the findings of atherosclerosis and small vessel ischemic disease without other acute process. ? ?Given concern for possible CVA I consulted with neurology patient was seen at bedside by Dr. Curly Shores who recommended MRI imaging of the head and C-spine with plan for her to place these orders. ? ?Care of patient signed over to assuming father at approximately 86 with plan to follow-up MRI imaging and reassess.  I will also check a B12 given patient is an alcoholic and is reporting sensory symptoms. ?  ? ? ?FINAL CLINICAL IMPRESSION(S) / ED DIAGNOSES  ? ?Final diagnoses:  ?Numbness  ?Alcohol abuse  ? ? ? ?Rx / DC Orders  ? ?ED Discharge Orders   ? ? None  ? ?  ? ? ? ?Note:  This  document was prepared using Dragon voice recognition software and may include unintentional dictation errors. ?  ?Lucrezia Starch, MD ?07/04/21 1554 ? ?

## 2021-07-04 NOTE — Assessment & Plan Note (Signed)
-   Labetalol 10 mg IV every 2 hours as needed for SBP greater than 160, 4 doses ordered ?- Patient states that she is currently not on any medication including antihypertensive and she reports the last time she was at a PCP, in December 2022, they were going to prescribe her blood pressure medicine on recheck ?- Advised patient to follow-up with primary care provider for chronic hypertension management ?

## 2021-07-04 NOTE — Assessment & Plan Note (Addendum)
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

## 2021-07-04 NOTE — H&P (Signed)
?History and Physical  ? ?Brandy Garner DOB: 03-10-1966 DOA: 07/04/2021 ? ?PCP: [facility]  ?Outpatient Specialists: Dr. Allegra Lai, Gastroenterologist  ?Patient coming from: home via POV ? ?I have personally briefly reviewed patient's old medical records in [facility] EMR. ? ?Chief Concern: numbness and tingling on left side of whole body  ? ?HPI: Brandy Garner is a 56 year old female with history of alcohol dependence, chronic nicotine use, who presents emergency department for chief concerns of left leg and foot tingling and left arm and hand tingling with numbness.   ? ?Initial vitals in the emergency department showed temperature of 98.9, respiration rate 18, heart rate 122, blood pressure 197/110, SPO2 98% on room air. ? ?Serum sodium 134, potassium 2.5, chloride 101, bicarb of 23, BUN of less than 5, serum creatinine of 0.51, GFR greater than 60, nonfasting blood glucose 141, WBC of 6.5, hemoglobin 16.2, platelets of 282.  PT was 12.5, INR of 2.9. ? ?CTA of the head and neck with and without contrast material: Mild atherosclerosis of the head and neck without large vessel occlusion or significant proximal stenosis.  No evidence of acute intracranial abnormality.  Mild chronic small vessel ischemic disease and cerebral white matter.  Aortic atherosclerosis. ? ?MRI of the brain with and without contrast was read as small area of restricted diffusion in the right thalamus, consistent with acute to subacute infarct. ? ?ED treatment: Patient was given aspirin 324 mg one-time dose, and patient was started on CIWA protocol including folic acid and thiamine. ? ?At bedside, patient was able to tell me her full name, age, current location and current calendar year.  She reports that on Thursday evening, 06/28/2021 she noticed the left side of her back started to become numb.  She went to bed and woke up and the numbness had spread from her back to her arms, left side of her face, and  her legs.  The symptoms continued over the weekend and did not improve thus prompting her to present to the emergency department on day of admission. ? ?She reports she is never felt this way before. ? ?She denies any chest pain, shortness of breath, dysuria, hematuria, diarrhea, swelling in her legs, syncope, changes to her vision, abdominal pain.   ? ?She states that the left side of her abdomen is still numb. ? ?She reports her last alcoholic beverage was evening of 07/03/2021.  She denies history of seizures when not drinking alcohol.  She endorses mild tremors when she does not get alcohol. ? ?Social history: She lives at home with her husband. She is a current tobacco user, started at age 73 and currently smokes 1 pack/day.  She states she is not ready to quit.  She consumes alcohol daily stating that she drinks 4-6 light beers daily.  She states the last time that she has been without beers for 3 days was about 2 years ago.  She denies drug use.  She currently works as a Production designer, theatre/television/film. ? ?ROS: ?Constitutional: no weight change, no fever ?ENT/Mouth: no sore throat, no rhinorrhea ?Eyes: no eye pain, no vision changes ?Cardiovascular: no chest pain, no dyspnea,  no edema, no palpitations ?Respiratory: no cough, no sputum, no wheezing ?Gastrointestinal: no nausea, no vomiting, no diarrhea, no constipation ?Genitourinary: no urinary incontinence, no dysuria, no hematuria ?Musculoskeletal: no arthralgias, no myalgias ?Skin: no skin lesions, no pruritus, ?Neuro: + weakness, no loss of consciousness, no syncope, left side of body numbness ?Psych:  no anxiety, no depression, no decrease appetite ?Heme/Lymph: no bruising, no bleeding ? ?ED Course: Discussed with emergency medicine provider, patient requiring hospitalization for chief concerns of acute CVA. ? ?Assessment/Plan ? ?Principal Problem: ?  Stroke [facility](HCC) ?Active Problems: ?  Alcohol use ?  Tobacco abuse ?  Numbness and tingling in left arm ?  Essential  hypertension ?  ?Assessment and Plan: ? 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 ?Essential hypertension ?- Labetalol 10 mg IV every 2 hours as needed for SBP greater than 160, 4 doses ordered ?- Patient states that she is currently not on any medication including antihypertensive and she reports the last time she was at a PCP, in December 2022, they were going to prescribe her blood pressure medicine on recheck ?- Advised patient to follow-up with primary care provider for chronic hypertension management ? ?Numbness and tingling in left arm ?- Check B12 and methylmalonic acid, can be followed up outpatient per neurology ? ?Tobacco abuse ?- Nicotine patch as needed for nicotine craving ordered, 5 days ? ?Alcohol use ?- Continue CIWA protocol ? ?Chart reviewed.  ? ?DVT prophylaxis: Enoxaparin ?Code Status: Full code ?Diet: Heart healthy ?Family Communication: No ?Disposition Plan: Pending clinical course ?Consults called: Neurology ?Admission status: Progressive, observation ? ?Past Medical History:  ?Diagnosis Date  ? Alcohol use   ? Diabetes mellitus without complication (HCC)   ? Tobacco abuse   ? ?Past Surgical History:  ?Procedure Laterality Date  ? CESAREAN SECTION    ? ESOPHAGOGASTRODUODENOSCOPY N/A 07/13/2019  ? Procedure: ESOPHAGOGASTRODUODENOSCOPY (EGD);  Surgeon: Toney ReilVanga, Rohini Reddy, MD;  Location: [facility] ENDOSCOPY;  Service: Gastroenterology;  Laterality: N/A;  ? ?Social History:  reports that she has been smoking. She has never used smokeless tobacco. She reports current alcohol use. She reports that she does not use drugs. ? ?Allergies  ?Allergen Reactions  ?  Fentanyl Hives and Nausea And Vomiting  ?   When pt had a c-section   ? ?Family History  ?Problem Relation Age of Onset  ? Diabetes Mellitus II Father   ? Heart disease Father   ? ?Family history: Family history reviewed and not pertinent ? ?Prior to Admission medications   ?Medication Sig Start Date End Date Taking? Authorizing Provider  ?pantoprazole (PROTONIX) 40 MG tablet Take 1 tablet (40 mg total) by mouth 2 (two) times daily. 07/13/19   Marrion CoyZhang, Dekui, MD  ?sucralfate (CARAFATE) 1 g tablet Take 1 tablet (1 g total) by mouth 4 (four) times daily. 07/13/19 07/12/20  Marrion CoyZhang, Dekui, MD  ? ?Physical Exam: ?Vitals:  ? 07/04/21 1642 07/04/21 1920 07/04/21 2110 07/04/21 2140  ?BP: (!) 166/83 (!) 175/85 (!) 192/91 (!) 146/89  ?Pulse: 97 88 84 77  ?Resp: 16 15 16 16   ?Temp:  98.9 ?F (37.2 ?C)    ?TempSrc:      ?SpO2: 97% 99%  96%  ?Weight:      ? ?Constitutional: appears older than chronological age, NAD, calm, comfortable ?Eyes: PERRL, lids and conjunctivae normal ?ENMT: Mucous membranes are moist. Posterior pharynx clear of any exudate or lesions. Age-appropriate dentition. Hearing appropriate ?Neck: normal, supple, no masses, no thyromegaly ?Respiratory: clear to auscultation bilaterally, no wheezing, no crackles.  Normal respiratory effort. No accessory muscle use.  ?Cardiovascular: Regular rate and rhythm, no murmurs / rubs / gallops. No extremity edema. 2+ pedal pulses. No carotid bruits.  ?Abdomen: no tenderness, no masses palpated, no hepatosplenomegaly. Bowel sounds positive.  ?Musculoskeletal: no clubbing / cyanosis. No joint deformity upper and lower extremities. Good ROM, no contractures, no atrophy. Normal muscle tone.  ?Skin: no rashes, lesions, ulcers. No induration ?Neurologic: Sensation intact. Strength 5/5 in all 4.  ?Psychiatric: Normal judgment and insight. Alert and oriented x 3. Normal mood.  ? ?EKG: independently reviewed, showing sinus tachycardia with rate of 121, QTc 462 ? ?Chest x-ray on Admission:  Not indicated ? ?CT ANGIO HEAD NECK W WO CM ? ?Result Date: 07/04/2021 ?CLINICAL DATA:  Neuro deficit, acute, stroke suspected. Left-sided numbness and tingling. EXAM: CT ANGIOGRAPHY HEAD AND NECK TECHNIQUE:

## 2021-07-04 NOTE — ED Notes (Signed)
Pt at MRI

## 2021-07-04 NOTE — ED Notes (Signed)
Admitting Provider at bedside. 

## 2021-07-04 NOTE — Assessment & Plan Note (Signed)
Continue CIWA protocol. ?

## 2021-07-04 NOTE — ED Notes (Signed)
Pt speaking to MRI 

## 2021-07-04 NOTE — Assessment & Plan Note (Signed)
-   Nicotine patch as needed for nicotine craving ordered, 5 days ?

## 2021-07-04 NOTE — Assessment & Plan Note (Signed)
-   Check B12 and methylmalonic acid, can be followed up outpatient per neurology ?

## 2021-07-05 ENCOUNTER — Observation Stay
Admit: 2021-07-05 | Discharge: 2021-07-05 | Disposition: A | Discharge status: Home / Self Care | Payer: BLUE CROSS/BLUE SHIELD | Attending: Internal Medicine | Admitting: Internal Medicine

## 2021-07-05 DIAGNOSIS — I639 Cerebral infarction, unspecified: Secondary | ICD-10-CM | POA: Diagnosis not present

## 2021-07-05 DIAGNOSIS — I6381 Other cerebral infarction due to occlusion or stenosis of small artery: Secondary | ICD-10-CM

## 2021-07-05 DIAGNOSIS — Z720 Tobacco use: Secondary | ICD-10-CM | POA: Diagnosis not present

## 2021-07-05 LAB — LIPID PANEL
Cholesterol: 179 mg/dL (ref 0–200)
HDL: 54 mg/dL (ref >40)
LDL Cholesterol: 114 mg/dL — ABNORMAL HIGH (ref 0–99)
Total CHOL/HDL Ratio: 3.3 RATIO
Triglycerides: 53 mg/dL (ref <150)
VLDL: 11 mg/dL (ref 0–40)

## 2021-07-05 LAB — URINE DRUG SCREEN, QUALITATIVE (ARMC ONLY)
Amphetamines, Ur Screen: NOT DETECTED
Barbiturates, Ur Screen: NOT DETECTED
Benzodiazepine, Ur Scrn: NOT DETECTED
Cannabinoid 50 Ng, Ur ~~LOC~~: NOT DETECTED
Cocaine Metabolite,Ur ~~LOC~~: NOT DETECTED
MDMA (Ecstasy)Ur Screen: NOT DETECTED
Methadone Scn, Ur: NOT DETECTED
Opiate, Ur Screen: NOT DETECTED
Phencyclidine (PCP) Ur S: NOT DETECTED
Tricyclic, Ur Screen: NOT DETECTED

## 2021-07-05 LAB — ECHOCARDIOGRAM COMPLETE
AR max vel: 3.36 cm2
AV Area VTI: 3.55 cm2
AV Area mean vel: 3.23 cm2
AV Mean grad: 3.3 mmHg
AV Peak grad: 5.4 mmHg
Ao pk vel: 1.16 m/s
Area-P 1/2: 2.96 cm2
Height: 66 in
MV VTI: 2.51 cm2
S' Lateral: 2.2 cm
Weight: 2496 oz

## 2021-07-05 LAB — HIV ANTIBODY (ROUTINE TESTING W REFLEX): HIV Screen 4th Generation wRfx: NONREACTIVE

## 2021-07-05 LAB — VITAMIN B12: Vitamin B-12: 121 pg/mL — ABNORMAL LOW (ref 180–914)

## 2021-07-05 MED ORDER — PANTOPRAZOLE SODIUM 40 MG PO TBEC
40.0000 mg | DELAYED_RELEASE_TABLET | Freq: Every day | ORAL | 0 refills | Status: AC
Start: 2021-07-05 — End: 2021-08-04

## 2021-07-05 MED ORDER — ATORVASTATIN CALCIUM 80 MG PO TABS: 80.0000 mg | ORAL_TABLET | Freq: Every day | ORAL | 0 refills | Status: AC

## 2021-07-05 MED ORDER — AMLODIPINE BESYLATE 5 MG PO TABS: 5.0000 mg | ORAL_TABLET | Freq: Every day | ORAL | 0 refills | Status: AC

## 2021-07-05 MED ORDER — ASPIRIN 81 MG PO TBEC: 81.0000 mg | DELAYED_RELEASE_TABLET | Freq: Every day | ORAL | 0 refills | Status: AC

## 2021-07-05 MED ORDER — ATORVASTATIN CALCIUM 20 MG PO TABS
80.0000 mg | ORAL_TABLET | Freq: Every day | ORAL | Status: DC
  Administered 2021-07-05: 80 mg via ORAL
  Filled 2021-07-05: qty 4

## 2021-07-05 MED ORDER — CLOPIDOGREL BISULFATE 75 MG PO TABS: 75.0000 mg | ORAL_TABLET | Freq: Every day | ORAL | 0 refills | Status: AC

## 2021-07-05 MED ORDER — CLOPIDOGREL BISULFATE 75 MG PO TABS
75.0000 mg | ORAL_TABLET | Freq: Every day | ORAL | Status: DC
  Administered 2021-07-05: 75 mg via ORAL
  Filled 2021-07-05: qty 1

## 2021-07-05 MED ORDER — NICOTINE 21 MG/24HR TD PT24: 21.0000 mg | MEDICATED_PATCH | Freq: Every day | TRANSDERMAL | 0 refills | Status: AC | PRN

## 2021-07-05 MED ORDER — THIAMINE HCL 100 MG PO TABS
100.0000 mg | ORAL_TABLET | Freq: Every day | ORAL | 0 refills | Status: AC
Start: 2021-07-06 — End: 2021-08-05

## 2021-07-05 MED ORDER — FOLIC ACID 1 MG PO TABS
1.0000 mg | ORAL_TABLET | Freq: Every day | ORAL | 0 refills | Status: AC
Start: 2021-07-06 — End: 2021-08-05

## 2021-07-05 NOTE — Progress Notes (Signed)
OT Cancellation Note ? ?Patient Details ?Name: Brandy Garner ?MRN: [id number] ?DOB: June 05, 1965 ? ? ?Cancelled Treatment:    Reason Eval/Treat Not Completed: OT screened, no needs identified, will sign off. Order received, chart reviewed. Pt back to baseline functional independence including completing crossword and toileting without assistance. Reports mild tingling in LLE however Independent for all mobility. No skilled OT needs identified. Will sign off. Please re-consult if additional needs arise.  ? ?Kathie Dike, M.S. OTR/L  ?07/05/21, 9:18 AM  ?ascom [phone] ? ?

## 2021-07-05 NOTE — ED Notes (Signed)
Ambulatory to bathroom independently with steady gait. Ambulatory back to bed. Ice water given to patient per request. Denies additional needs. No distress noted. ?

## 2021-07-05 NOTE — Progress Notes (Signed)
Admission profile updated. ?

## 2021-07-05 NOTE — Progress Notes (Signed)
NEUROLOGY CONSULTATION PROGRESS NOTE  ? ?Date of service: July 05, 2021 ?Patient Name: Brandy Garner ?MRN:  [id number] ?DOB:  07/15/1965 ? ?Brief HPI  ?Brandy Garner is a 56 y.o. female with PMH significant for heavy alcohol use, 1 pack/day smoking, diabetes. She presented with tingling in her Left arm and Left leg and found to have a R thalamic stroke on MRI Brain. ? ?Workup with CTA with mild atherosclerosis of multiple vessels without any LVO. HbA1c is pending, LDL elevated to 114. TTE is pending. ?  ?Interval Hx  ? ?No acute events. Tingling seems to have improved significantly. ? ?Vitals  ? ?Vitals:  ? 07/05/21 0332 07/05/21 0400 07/05/21 0700 07/05/21 0800  ?BP:  131/61 (!) 142/83 (!) 158/68  ?Pulse:  70 71 87  ?Resp:  17 17 16   ?Temp:      ?TempSrc:      ?SpO2:  95% 94% 94%  ?Weight:      ?Height: 5\' 6"  (1.676 m)     ?  ? ?Body mass index is 25.18 kg/m?. ? ?Physical Exam  ? ?General: Laying comfortably in bed; in no acute distress.  ?HENT: Normal oropharynx and mucosa. Normal external appearance of ears and nose.  ?Neck: Supple, no pain or tenderness  ?CV: No JVD. No peripheral edema.  ?Pulmonary: Symmetric Chest rise. Normal respiratory effort.  ?Abdomen: Soft to touch, non-tender.  ?Ext: No cyanosis, edema, or deformity  ?Skin: No rash. Normal palpation of skin.   ?Musculoskeletal: Normal digits and nails by inspection. No clubbing.  ? ?Neurologic Examination  ?Mental status/Cognition: Alert, oriented to self, place, month and year, good attention.  ?Speech/language: Fluent, comprehension intact, object naming intact, repetition intact.  ?Cranial nerves:  ? CN II Pupils equal and reactive to light, no VF deficits   ? CN III,IV,VI EOM intact, no gaze preference or deviation, no nystagmus   ? CN V normal sensation in V1, V2, and V3 segments bilaterally   ? CN VII no asymmetry, no nasolabial fold flattening   ? CN VIII normal hearing to speech   ? CN IX & X normal palatal elevation, no uvular deviation   ?  CN XI 5/5 head turn and 5/5 shoulder shrug bilaterally   ? CN XII midline tongue protrusion   ? ?Motor:  ?Muscle bulk: normal, tone normal, pronator drift none tremor none ?Mvmt Root Nerve  Muscle Right Left Comments  ?SA C5/6 Ax Deltoid 5 5   ?EF C5/6 Mc Biceps 5 5   ?EE C6/7/8 Rad Triceps 5 5   ?WF C6/7 Med FCR     ?WE C7/8 PIN ECU     ?F Ab C8/T1 U ADM/FDI 5 5   ?HF L1/2/3 Fem Illopsoas 5 5   ?KE L2/3/4 Fem Quad 5 5   ?DF L4/5 D Peron Tib Ant 5 5   ?PF S1/2 Tibial Grc/Sol 5 5   ? ?Reflexes: ? Right Left Comments  ?Pectoralis     ? Biceps (C5/6) 2 2   ?Brachioradialis (C5/6) 2 2   ? Triceps (C6/7) 2 2   ? Patellar (L3/4) 2 2   ? Achilles (S1)     ? Hoffman     ? Plantar     ?Jaw jerk   ? ?Sensation: ? Light touch Intact throughout, subjective tingling in L thigh and L flank.  ? Pin prick   ? Temperature   ? Vibration   ?Proprioception   ? ?Coordination/Complex Motor:  ?- Finger to Nose  intact BL ?- Heel to shin intact BL ?- Rapid alternating movement are normal ?- Gait: Stride length short. Arm swing poor. Base width narrow. ? ?Labs  ? ?Basic Metabolic Panel:  ?Lab Results  ?Component Value Date  ? NA 134 (L) 07/04/2021  ? K 3.5 07/04/2021  ? CO2 23 07/04/2021  ? GLUCOSE 141 (H) 07/04/2021  ? BUN <5 (L) 07/04/2021  ? CREATININE 0.51 07/04/2021  ? CALCIUM 9.1 07/04/2021  ? GFRNONAA >60 07/04/2021  ? GFRAA >60 07/13/2019  ? ?HbA1c: No results found for: HGBA1C ?LDL:  ?Lab Results  ?Component Value Date  ? LDLCALC 114 (H) 07/05/2021  ? ?Urine Drug Screen:  ?   ?Component Value Date/Time  ? LABOPIA NONE DETECTED 07/04/2021 0527  ? COCAINSCRNUR NONE DETECTED 07/04/2021 0527  ? LABBENZ NONE DETECTED 07/04/2021 0527  ? AMPHETMU NONE DETECTED 07/04/2021 0527  ? THCU NONE DETECTED 07/04/2021 0527  ? LABBARB NONE DETECTED 07/04/2021 0527  ?  ?Alcohol Level  ?   ?Component Value Date/Time  ? ETH <10 07/12/2019 0623  ? ?No results found for: PHENYTOIN, ZONISAMIDE, LAMOTRIGINE, LEVETIRACETA ?No results found for: PHENYTOIN,  PHENOBARB, VALPROATE, CBMZ ? ?Imaging and Diagnostic studies  ? ?MRI Brain w + w/o C(personally reviewed): ?Small area of restricted diffusion in the right thalamus, consistent ?with acute to subacute infarct. ? ?MRI C spine w + w/o C(personally reviewed): ?1. C5-C6 mild spinal canal stenosis with moderate left and mild ?right neural foraminal narrowing. ?2. C6-C7 moderate bilateral neural foraminal narrowing. ?3. Mild neural foraminal narrowing on the right at C2-C3 and ?bilaterally at C4-C5. ?4. No spinal cord signal abnormality.  No abnormal enhancement. ? ?LDL: 114 ?HbA1c is pending. ?TTE is pending ? ?Impression  ? ?Brandy Garner is a 56 y.o. female with PMH significant for heavy alcohol use, 1 pack/day smoking, diabetes. She presented with tingling in her Left arm and Left leg and found to have a R thalamic stroke on MRI Brain. Etiology of her stroke is most likely small vessel disease. Risk factors include: smoking, DM2. ? ?Recommendations  ? ?R thalamic stroke,likely due to small vessel disease: ?- Frequent Neuro checks per stroke unit protocol ?- TTE pending. ?- LDL elevated to 114, started on Atorvastatin 80mg  daily. ?- HbA1c is pending. ?- Antithrombotic - Aspirin 81mg  daily along with plavix 75mg  daily x 21 days followed by Aspirin 81mg  daily alone. ?- Recommend DVT ppx ?- SBP goal - permissive hypertension first 24 h < 220/110. Held home meds.  ?- Recommend Telemetry monitoring for arrythmia ?- Recommend bedside swallow screen prior to PO intake. ?- Stroke education booklet ?- Recommend PT/OT/SLP consult ? ?Chronic Alcohol use: ?- Thiamine PO 100mg  daiy at discharge given EtOh use. ?- counseled on the importance of quitting alcohol. ?- CIWA precautions. ? ?Smoking: ?- Counseled on the importance of quitting smoking to reduce risk of stroke. She feels she is ready and wants to put in the effort. Discussed that nicotine withdrawal symptoms peak in the first three days of smoking cessation and subside  over the next three to four weeks. Discussed options including counseling, pharmacological options including Nicotine patch, Chantix, Wellbutrin. We discussed quit date. I also provided her a handout with resources and free resources offered by the state of on " ". She will also reach out to her PCP when she follows up next time. ? ?________________________________________________________________ ? ? ?Thank you for the opportunity to take part in the care of this patient. If you have any further  questions, please contact the neurology consultation attending. ? ?Signed, ? ?Erick BlinksSalman Nicandro Perrault ?[facility] ?Pager Number [phone] ? ?

## 2021-07-05 NOTE — Progress Notes (Signed)
*  PRELIMINARY RESULTS* ?Echocardiogram ?2D Echocardiogram has been performed. ? ?Random Dobrowski, Dorene Sorrow ?07/05/2021, 2:03 PM ?

## 2021-07-05 NOTE — Discharge Summary (Signed)
Physician Discharge Summary  ?TRIANA COOVER ZOX:[id number] DOB: 1965/09/26 DOA: 07/04/2021 ? ?PCP: [facility] ? ?Admit date: 07/04/2021 ?Discharge date: 07/05/2021 ? ?Admitted From: Home ?Disposition:  Home ? ?Recommendations for Outpatient Follow-up:  ?Follow up with PCP in 1-2 weeks ?Follow up with [facility] neurology in 2 weeks ? ?Home Health:No  ?Equipment/Devices:None  ? ?Discharge Condition:Stable  ?CODE STATUS:FULL  ?Diet recommendation: Heart healthy ? ?Brief/Interim Summary: ?56 year old female with history of alcohol dependence, chronic nicotine use, who presents emergency department for chief concerns of left leg and foot tingling and left arm and hand tingling with numbness.   ?  ?Initial vitals in the emergency department showed temperature of 98.9, respiration rate 18, heart rate 122, blood pressure 197/110, SPO2 98% on room air. ? ?Serum sodium 134, potassium 2.5, chloride 101, bicarb of 23, BUN of less than 5, serum creatinine of 0.51, GFR greater than 60, nonfasting blood glucose 141, WBC of 6.5, hemoglobin 16.2, platelets of 282.  PT was 12.5, INR of 2.9. ?  ?CTA of the head and neck with and without contrast material: Mild atherosclerosis of the head and neck without large vessel occlusion or significant proximal stenosis.  No evidence of acute intracranial abnormality.  Mild chronic small vessel ischemic disease and cerebral white matter.  Aortic atherosclerosis. ? ?MRI of the brain with and without contrast was read as small area of restricted diffusion in the right thalamus, consistent with acute to subacute infarct. ? ?Seen by PT OT ST.  Cleared for DC.  Appreciate nuerology input.  DAPT x 3 weeks, followed by ASA monotherapy.  FU OP neurology and PCP ? ? ? ? ? ?Discharge Diagnoses:  ?Principal Problem: ?  Stroke [facility]) ?Active Problems: ?  Alcohol use ?  Tobacco abuse ?  Numbness and tingling in left arm ?  Essential hypertension ?  Acute CVA (cerebrovascular accident) (HCC) ? ?*  Stroke [facility]) ?Left-sided numbness and tingling presumed secondary to right thalamus ischemic stroke ?MRI confirmed ?Symptoms mild ?Improving at time of dc ?Plan: ?DC home ?DAPT x 3 weeks (ASA and plavix), followed by ASA monotherapy ?BP control ?High intensity statin ?Smoking cessation ?FU OP neurology and PCP ? ? ?Discharge Instructions ? ?Discharge Instructions   ? ? Diet - low sodium heart healthy   Complete by: As directed ?  ? Increase activity slowly   Complete by: As directed ?  ? ?  ? ?Allergies as of 07/05/2021   ? ?   Reactions  ? Fentanyl Hives, Nausea And Vomiting  ?  When pt had a c-section   ? ?  ? ?  ?Medication List  ?  ? ?STOP taking these medications   ? ?sucralfate 1 g tablet ?Commonly known as: Carafate ?  ? ?  ? ?TAKE these medications   ? ?amLODipine 5 MG tablet ?Commonly known as: NORVASC ?Take 1 tablet (5 mg total) by mouth daily. ?Start taking on: July 06, 2021 ?  ?aspirin 81 MG EC tablet ?Take 1 tablet (81 mg total) by mouth daily. Swallow whole. ?Start taking on: July 06, 2021 ?  ?atorvastatin 80 MG tablet ?Commonly known as: LIPITOR ?Take 1 tablet (80 mg total) by mouth daily. ?Start taking on: July 06, 2021 ?  ?clopidogrel 75 MG tablet ?Commonly known as: Plavix ?Take 1 tablet (75 mg total) by mouth daily for 21 days. ?  ?folic acid 1 MG tablet ?Commonly known as: FOLVITE ?Take 1 tablet (1 mg total) by mouth daily. ?Start taking  on: July 06, 2021 ?  ?nicotine 21 mg/24hr patch ?Commonly known as: NICODERM CQ - dosed in mg/24 hours ?Place 1 patch (21 mg total) onto the skin daily as needed (nicotine craving). ?  ?pantoprazole 40 MG tablet ?Commonly known as: PROTONIX ?Take 1 tablet (40 mg total) by mouth daily. ?What changed: when to take this ?  ?thiamine 100 MG tablet ?Take 1 tablet (100 mg total) by mouth daily. ?Start taking on: July 06, 2021 ?  ? ?  ? ? Follow-up Information   ? ? Lonell FaceShah, Hemang K, MD. Schedule an appointment as soon as possible for a visit in 1 week(s).   ?Specialty:  Neurology ?Why: Please call office to set up followup appointment with any [facility] provider [facility] ?Contact information: ?[address] ?[facility] ?[place] [address] ?[phone] ? ? ?  ?  ? ? [facility]. Schedule an appointment as soon as possible for a visit in 1 week(s).   ?Specialty: General Practice ?Contact information: ?[address] ?[place] [address] ?[phone] ? ? ?  ?  ? ?  ?  ? ?  ? ?Allergies  ?Allergen Reactions  ? Fentanyl Hives and Nausea And Vomiting  ?   When pt had a c-section   ? ? ?Consultations: ?Neurology  ? ? ?Procedures/Studies: ?CT ANGIO HEAD NECK W WO CM ? ?Result Date: 07/04/2021 ?CLINICAL DATA:  Neuro deficit, acute, stroke suspected. Left-sided numbness and tingling. EXAM: CT ANGIOGRAPHY HEAD AND NECK TECHNIQUE: Multidetector CT imaging of the head and neck was performed using the standard protocol during bolus administration of intravenous contrast. Multiplanar CT image reconstructions and MIPs were obtained to evaluate the vascular anatomy. Carotid stenosis measurements (when applicable) are obtained utilizing NASCET criteria, using the distal internal carotid diameter as the denominator. RADIATION DOSE REDUCTION: This exam was performed according to the departmental dose-optimization program which includes automated exposure control, adjustment of the mA and/or kV according to patient size and/or use of iterative reconstruction technique. CONTRAST:  75mL OMNIPAQUE IOHEXOL 350 MG/ML SOLN COMPARISON:  None. FINDINGS: CT HEAD FINDINGS Brain: There is no evidence of an acute infarct, intracranial hemorrhage, mass, midline shift, or extra-axial fluid collection. The ventricles and sulci are within normal limits. Hypodensities in the cerebral white matter bilaterally are nonspecific but compatible with mild chronic small vessel ischemic disease. Vascular: Calcified atherosclerosis at the skull base. Skull: No  fracture or suspicious osseous lesion. Sinuses: Mucosal thickening inferiorly in the right maxillary sinus. Clear mastoid air cells. Orbits: Right cataract extraction. Review of the MIP images confirms the above findings CTA NECK FINDINGS Aortic arch: Standard 3 vessel aortic arch with mild atherosclerotic plaque. Widely patent arch vessel origins. Right carotid system: Patent with a small amount of calcified and soft plaque in the proximal ICA. No evidence of a significant stenosis or dissection. Left carotid system: Patent without evidence of stenosis, dissection, or significant atherosclerosis. Vertebral arteries: Patent without evidence of stenosis, dissection, or significant atherosclerosis. Slightly dominant right vertebral artery. Skeleton: Moderately advanced cervical and upper thoracic facet arthrosis. Other neck: No evidence of cervical lymphadenopathy. Subcentimeter thyroid nodules for which no follow-up imaging is recommended. Upper chest: No apical lung consolidation or mass. Review of the MIP images confirms the above findings CTA HEAD FINDINGS Anterior circulation: The internal carotid arteries are patent from skull base to carotid termini with mild atherosclerotic plaque bilaterally not resulting in significant stenosis. ACAs and MCAs are patent without evidence of a proximal branch occlusion  or significant proximal stenosis. No aneurysm is identified. Posterior circulation: The intracranial vertebral arteries are widely patent to the basilar. Patent AICA and SCA origins are seen bilaterally. The basilar artery is widely patent. Posterior communicating arteries are diminutive or absent. Both PCAs are patent without evidence of a significant proximal stenosis. No aneurysm is identified. Venous sinuses: Patent. Anatomic variants: None. Review of the MIP images confirms the above findings IMPRESSION: 1. Mild atherosclerosis in the head and neck without large vessel occlusion or significant proximal  stenosis. 2. No evidence of acute intracranial abnormality. 3. Mild chronic small vessel ischemic disease in the cerebral white matter. 4. Aortic Atherosclerosis (ICD10-I70.0). Electronically Signed   By: All

## 2021-07-05 NOTE — Progress Notes (Signed)
PT Cancellation Note ? ?Patient Details ?Name: Brandy Garner ?MRN: [id number] ?DOB: [birth year] ? ? ?Cancelled Treatment:    Reason Eval/Treat Not Completed: Other (comment). Chart reviewed. Discussed with OT. Pt is ambulatory independently with symptoms improving. No formal need for PT at this time. Please re-consult if needs change. Will sign off. ? ? ?[name] ?07/05/2021, 9:15 AM ?Elizabeth Palau, PT, DPT, GCS ?[phone] ? ?

## 2021-07-05 NOTE — Discharge Instructions (Signed)
-   Please take both aspirin and Plavix daily as prescribed for 21 days.  After those days, can discontinue Plavix and take aspirin 81mg  daily only. ?- Strongly urge immediate smoking cessation.  Nicotine patches prescribed ?- Your blood pressure is uncontrolled.  Please start taking amlodipine 5mg  daily.  First dose 4/7 ?- Follow up with your primary care provider within 1-2 weeks ?- Please call [facility] neurology to set up follow up appointment.  First available with any available provider ? ?

## 2021-07-05 NOTE — ED Notes (Signed)
Pt ambulated to the restroom without assistance

## 2021-07-05 NOTE — Evaluation (Signed)
Speech Language Pathology Evaluation ?Patient Details ?Name: Brandy Garner ?MRN: [id number] ?DOB: Dec 30, 1965 ?Today's Date: 07/05/2021 ?Time: 2751-7001 ?SLP Time Calculation (min) (ACUTE ONLY): 15 min ? ?Problem List:  ?Patient Active Problem List  ? Diagnosis Date Noted  ? Acute CVA (cerebrovascular accident) (HCC) 07/05/2021  ? Stroke (HCC) 07/04/2021  ? Numbness and tingling in left arm 07/04/2021  ? Essential hypertension 07/04/2021  ? GIB (gastrointestinal bleeding) 07/12/2019  ? Alcohol use 07/12/2019  ? Tobacco abuse   ? ?Past Medical History:  ?Past Medical History:  ?Diagnosis Date  ? Alcohol use   ? Diabetes mellitus without complication (HCC)   ? Tobacco abuse   ? ?Past Surgical History:  ?Past Surgical History:  ?Procedure Laterality Date  ? CESAREAN SECTION    ? ESOPHAGOGASTRODUODENOSCOPY N/A 07/13/2019  ? Procedure: ESOPHAGOGASTRODUODENOSCOPY (EGD);  Surgeon: Toney Reil, MD;  Location: [facility] ENDOSCOPY;  Service: Gastroenterology;  Laterality: N/A;  ? ?HPI:  ?Per Physician's H&P "Brandy Garner is a 56 year old female with history of alcohol dependence, chronic nicotine use, who presents emergency department for chief concerns of left leg and foot tingling and left arm and hand tingling with numbness.       Initial vitals in the emergency department showed temperature of 98.9, respiration rate 18, heart rate 122, blood pressure 197/110, SPO2 98% on room air.    Serum sodium 134, potassium 2.5, chloride 101, bicarb of 23, BUN of less than 5, serum creatinine of 0.51, GFR greater than 60, nonfasting blood glucose 141, WBC of 6.5, hemoglobin 16.2, platelets of 282.  PT was 12.5, INR of 2.9.     CTA of the head and neck with and without contrast material: Mild atherosclerosis of the head and neck without large vessel occlusion or significant proximal stenosis.  No evidence of acute intracranial abnormality.  Mild chronic small vessel ischemic disease and cerebral white matter.  Aortic  atherosclerosis.    MRI of the brain with and without contrast was read as small area of restricted diffusion in the right thalamus, consistent with acute to subacute infarct."  ? ?Assessment / Plan / Recommendation ?Clinical Impression ? Pt seen for cognitive-linguistic evaluation. Pt alert, pleasant, and cooperative. Denies changes to speech/language/cognition.  ? ?Assessment completed via informal means. Pt demonstrated intact primary language ability and intact functional cognitive-linguistic ability. Speech is fluent, appropriate, and without s/sx dysarthria. Intact naming and repetition. Intact conversation level expression and auditory comprehension. Pt A&Ox4, follows complex directives and yes/no questions, and verbalizes safe solutions to routine problems. ? ?No f/u SLP services warranted at this time. SLP to sign off.  ? ?Pt made aware of results of assessment, role of SLP, and SLP POC. Pt verbalized understanding/agreement.  ?   ?SLP Assessment ? SLP Recommendation/Assessment: Patient does not need any further Speech Lanaguage Pathology Services ?SLP Visit Diagnosis: Cognitive communication deficit (R41.841)  ?  ?Recommendations for follow up therapy are one component of a multi-disciplinary discharge planning process, led by the attending physician.  Recommendations may be updated based on patient status, additional functional criteria and insurance authorization. ?   ?Follow Up Recommendations ? No SLP follow up  ?  ?Assistance Recommended at Discharge ?  (defer to OT/PT)  ?Functional Status Assessment Patient has not had a recent decline in their functional status  ?   ?   ?SLP Evaluation ?Cognition ? Overall Cognitive Status: Within Functional Limits for tasks assessed ?Arousal/Alertness: Awake/alert ?Orientation Level: Oriented X4 ?Memory: Appears intact ?Awareness: Appears intact ?Problem  Solving: Appears intact  ?  ?   ?Comprehension ? Auditory Comprehension ?Overall Auditory Comprehension: Appears  within functional limits for tasks assessed ?Yes/No Questions: Within Functional Limits ?Commands: Within Functional Limits ?Conversation: Complex ?Visual Recognition/Discrimination ?Discrimination: Not tested ?Reading Comprehension ?Reading Status: Not tested  ?  ?Expression Expression ?Primary Mode of Expression: Verbal ?Verbal Expression ?Overall Verbal Expression: Appears within functional limits for tasks assessed ?Initiation: No impairment ?Repetition: No impairment ?Naming: No impairment ?Pragmatics: No impairment ?Written Expression ?Written Expression: Not tested   ?Oral / Motor ? Oral Motor/Sensory Function ?Overall Oral Motor/Sensory Function: Within functional limits ?Motor Speech ?Overall Motor Speech: Appears within functional limits for tasks assessed ?Respiration: Within functional limits ?Phonation: Normal ?Resonance: Within functional limits ?Articulation: Within functional limitis ?Intelligibility: Intelligible ?Motor Planning: Witnin functional limits   ?        ?Clyde Canterbury, M.S., CCC-SLP ?Speech-Language Pathologist ?[place] [facility] ?[phone]  ? ?Alessandra Bevels Emaree Chiu ?07/05/2021, 10:53 AM ? ?

## 2021-07-06 LAB — HEMOGLOBIN A1C
Hgb A1c MFr Bld: 5.3 % (ref 4.8–5.6)
Mean Plasma Glucose: 105 mg/dL

## 2021-07-08 LAB — METHYLMALONIC ACID, SERUM: Methylmalonic Acid, Quantitative: 160 nmol/L (ref 0–378)

## 2022-12-05 DIAGNOSIS — Z720 Tobacco use: Secondary | ICD-10-CM | POA: Diagnosis not present

## 2022-12-05 DIAGNOSIS — H6122 Impacted cerumen, left ear: Secondary | ICD-10-CM | POA: Diagnosis not present

## 2022-12-05 DIAGNOSIS — Z6825 Body mass index (BMI) 25.0-25.9, adult: Secondary | ICD-10-CM | POA: Diagnosis not present

## 2022-12-05 DIAGNOSIS — I10 Essential (primary) hypertension: Secondary | ICD-10-CM | POA: Diagnosis not present

## 2022-12-09 DIAGNOSIS — Z6825 Body mass index (BMI) 25.0-25.9, adult: Secondary | ICD-10-CM | POA: Diagnosis not present

## 2022-12-09 DIAGNOSIS — H6123 Impacted cerumen, bilateral: Secondary | ICD-10-CM | POA: Diagnosis not present

## 2022-12-09 DIAGNOSIS — B353 Tinea pedis: Secondary | ICD-10-CM | POA: Diagnosis not present

## 2022-12-09 DIAGNOSIS — Z760 Encounter for issue of repeat prescription: Secondary | ICD-10-CM | POA: Diagnosis not present

## 2022-12-09 DIAGNOSIS — I10 Essential (primary) hypertension: Secondary | ICD-10-CM | POA: Diagnosis not present

## 2023-02-13 DIAGNOSIS — I10 Essential (primary) hypertension: Secondary | ICD-10-CM | POA: Diagnosis not present

## 2023-02-13 DIAGNOSIS — E538 Deficiency of other specified B group vitamins: Secondary | ICD-10-CM | POA: Diagnosis not present

## 2023-02-13 DIAGNOSIS — Z1331 Encounter for screening for depression: Secondary | ICD-10-CM | POA: Diagnosis not present

## 2023-02-13 DIAGNOSIS — Z13220 Encounter for screening for lipoid disorders: Secondary | ICD-10-CM | POA: Diagnosis not present

## 2023-02-13 DIAGNOSIS — B353 Tinea pedis: Secondary | ICD-10-CM | POA: Diagnosis not present

## 2023-02-13 DIAGNOSIS — Z0131 Encounter for examination of blood pressure with abnormal findings: Secondary | ICD-10-CM | POA: Diagnosis not present

## 2023-02-13 DIAGNOSIS — Z1389 Encounter for screening for other disorder: Secondary | ICD-10-CM | POA: Diagnosis not present

## 2023-02-13 DIAGNOSIS — Z131 Encounter for screening for diabetes mellitus: Secondary | ICD-10-CM | POA: Diagnosis not present

## 2023-02-21 ENCOUNTER — Other Ambulatory Visit: Payer: Self-pay | Admitting: Family Medicine

## 2023-02-21 DIAGNOSIS — Z1231 Encounter for screening mammogram for malignant neoplasm of breast: Secondary | ICD-10-CM

## 2023-03-19 DIAGNOSIS — Z760 Encounter for issue of repeat prescription: Secondary | ICD-10-CM | POA: Diagnosis not present

## 2023-03-19 DIAGNOSIS — I10 Essential (primary) hypertension: Secondary | ICD-10-CM | POA: Diagnosis not present

## 2023-03-19 DIAGNOSIS — R0602 Shortness of breath: Secondary | ICD-10-CM | POA: Diagnosis not present

## 2023-03-19 DIAGNOSIS — Z6824 Body mass index (BMI) 24.0-24.9, adult: Secondary | ICD-10-CM | POA: Diagnosis not present

## 2023-03-19 DIAGNOSIS — R051 Acute cough: Secondary | ICD-10-CM | POA: Diagnosis not present

## 2023-03-19 DIAGNOSIS — Z8619 Personal history of other infectious and parasitic diseases: Secondary | ICD-10-CM | POA: Diagnosis not present

## 2023-03-19 DIAGNOSIS — J0140 Acute pansinusitis, unspecified: Secondary | ICD-10-CM | POA: Diagnosis not present
